# Patient Record
Sex: Female | Born: 1955 | Race: White | Hispanic: No | Marital: Single | Smoking: Former smoker
Health system: Southern US, Community
[De-identification: ages and names within clinical notes are randomized; demographics above are authoritative.]

## PROBLEM LIST (undated history)

## (undated) DIAGNOSIS — F17200 Nicotine dependence, unspecified, uncomplicated: Secondary | ICD-10-CM

## (undated) DIAGNOSIS — L719 Rosacea, unspecified: Secondary | ICD-10-CM

## (undated) DIAGNOSIS — R002 Palpitations: Secondary | ICD-10-CM

## (undated) DIAGNOSIS — F438 Other reactions to severe stress: Secondary | ICD-10-CM

## (undated) DIAGNOSIS — I701 Atherosclerosis of renal artery: Secondary | ICD-10-CM

## (undated) DIAGNOSIS — M810 Age-related osteoporosis without current pathological fracture: Secondary | ICD-10-CM

## (undated) DIAGNOSIS — I10 Essential (primary) hypertension: Secondary | ICD-10-CM

## (undated) DIAGNOSIS — N200 Calculus of kidney: Secondary | ICD-10-CM

## (undated) DIAGNOSIS — M79609 Pain in unspecified limb: Secondary | ICD-10-CM

## (undated) DIAGNOSIS — H409 Unspecified glaucoma: Secondary | ICD-10-CM

## (undated) HISTORY — DX: Atherosclerosis of renal artery: I70.1

## (undated) HISTORY — PX: TUBAL LIGATION: SHX77

## (undated) HISTORY — DX: Other reactions to severe stress: F43.8

## (undated) HISTORY — DX: Age-related osteoporosis without current pathological fracture: M81.0

## (undated) HISTORY — DX: Calculus of kidney: N20.0

## (undated) HISTORY — DX: Rosacea, unspecified: L71.9

## (undated) HISTORY — PX: OOPHORECTOMY: SHX86

## (undated) HISTORY — DX: Palpitations: R00.2

## (undated) HISTORY — DX: Essential (primary) hypertension: I10

## (undated) HISTORY — DX: Pain in unspecified limb: M79.609

## (undated) HISTORY — PX: DILATION AND CURETTAGE OF UTERUS: SHX78

## (undated) HISTORY — PX: URETERAL STENT PLACEMENT: SHX822

## (undated) HISTORY — DX: Unspecified glaucoma: H40.9

## (undated) HISTORY — DX: Nicotine dependence, unspecified, uncomplicated: F17.200

---

## 1998-01-28 ENCOUNTER — Other Ambulatory Visit: Admission: RE | Admit: 1998-01-28 | Discharge: 1998-01-28 | Payer: Self-pay | Admitting: *Deleted

## 1998-08-30 ENCOUNTER — Other Ambulatory Visit: Admission: RE | Admit: 1998-08-30 | Discharge: 1998-08-30 | Payer: Self-pay | Admitting: *Deleted

## 1999-09-05 ENCOUNTER — Other Ambulatory Visit: Admission: RE | Admit: 1999-09-05 | Discharge: 1999-09-05 | Payer: Self-pay | Admitting: *Deleted

## 1999-09-19 ENCOUNTER — Other Ambulatory Visit: Admission: RE | Admit: 1999-09-19 | Discharge: 1999-09-19 | Payer: Self-pay | Admitting: *Deleted

## 1999-09-19 ENCOUNTER — Encounter (INDEPENDENT_AMBULATORY_CARE_PROVIDER_SITE_OTHER): Payer: Self-pay | Admitting: Specialist

## 2000-10-09 ENCOUNTER — Other Ambulatory Visit: Admission: RE | Admit: 2000-10-09 | Discharge: 2000-10-09 | Payer: Self-pay | Admitting: *Deleted

## 2001-10-28 ENCOUNTER — Other Ambulatory Visit: Admission: RE | Admit: 2001-10-28 | Discharge: 2001-10-28 | Payer: Self-pay | Admitting: *Deleted

## 2002-11-25 ENCOUNTER — Other Ambulatory Visit: Admission: RE | Admit: 2002-11-25 | Discharge: 2002-11-25 | Payer: Self-pay | Admitting: *Deleted

## 2003-12-08 ENCOUNTER — Other Ambulatory Visit: Admission: RE | Admit: 2003-12-08 | Discharge: 2003-12-08 | Payer: Self-pay | Admitting: *Deleted

## 2004-07-18 ENCOUNTER — Ambulatory Visit: Payer: Self-pay | Admitting: Internal Medicine

## 2004-07-25 ENCOUNTER — Ambulatory Visit: Payer: Self-pay | Admitting: Internal Medicine

## 2004-12-12 ENCOUNTER — Other Ambulatory Visit: Admission: RE | Admit: 2004-12-12 | Discharge: 2004-12-12 | Payer: Self-pay | Admitting: *Deleted

## 2005-08-07 ENCOUNTER — Ambulatory Visit: Payer: Self-pay | Admitting: Internal Medicine

## 2005-08-15 ENCOUNTER — Ambulatory Visit: Payer: Self-pay | Admitting: Internal Medicine

## 2005-12-27 ENCOUNTER — Other Ambulatory Visit: Admission: RE | Admit: 2005-12-27 | Discharge: 2005-12-27 | Payer: Self-pay | Admitting: *Deleted

## 2006-05-17 ENCOUNTER — Ambulatory Visit: Payer: Self-pay | Admitting: Internal Medicine

## 2006-06-08 ENCOUNTER — Ambulatory Visit: Payer: Self-pay

## 2006-09-06 ENCOUNTER — Ambulatory Visit: Payer: Self-pay | Admitting: Internal Medicine

## 2006-09-06 LAB — CONVERTED CEMR LAB
ALT: 14 units/L (ref 0–40)
AST: 16 units/L (ref 0–37)
Albumin: 4 g/dL (ref 3.5–5.2)
Alkaline Phosphatase: 33 units/L — ABNORMAL LOW (ref 39–117)
BUN: 6 mg/dL (ref 6–23)
Basophils Relative: 1 % (ref 0.0–1.0)
Bilirubin Urine: NEGATIVE
Calcium: 9 mg/dL (ref 8.4–10.5)
Chloride: 105 meq/L (ref 96–112)
Creatinine, Ser: 0.7 mg/dL (ref 0.4–1.2)
Crystals: NEGATIVE
GFR calc non Af Amer: 95 mL/min
HCT: 38.1 % (ref 36.0–46.0)
HDL: 53 mg/dL (ref >39.0)
Hemoglobin: 12.9 g/dL (ref 12.0–15.0)
LDL Cholesterol: 77 mg/dL (ref 0–99)
Monocytes Absolute: 0.5 10*3/uL (ref 0.2–0.7)
Monocytes Relative: 7.8 % (ref 3.0–11.0)
Nitrite: NEGATIVE
RDW: 12.5 % (ref 11.5–14.6)
Specific Gravity, Urine: 1.02 (ref 1.000–1.03)
Total Bilirubin: 0.9 mg/dL (ref 0.3–1.2)
VLDL: 14 mg/dL (ref 0–40)

## 2006-09-13 ENCOUNTER — Ambulatory Visit: Payer: Self-pay | Admitting: Internal Medicine

## 2007-01-16 ENCOUNTER — Other Ambulatory Visit: Admission: RE | Admit: 2007-01-16 | Discharge: 2007-01-16 | Payer: Self-pay | Admitting: *Deleted

## 2007-06-05 ENCOUNTER — Encounter: Payer: Self-pay | Admitting: Internal Medicine

## 2007-12-04 ENCOUNTER — Encounter: Payer: Self-pay | Admitting: Internal Medicine

## 2008-03-10 ENCOUNTER — Ambulatory Visit: Payer: Self-pay | Admitting: Internal Medicine

## 2008-03-10 LAB — CONVERTED CEMR LAB
AST: 16 units/L (ref 0–37)
Albumin: 3.9 g/dL (ref 3.5–5.2)
Alkaline Phosphatase: 34 units/L — ABNORMAL LOW (ref 39–117)
BUN: 11 mg/dL (ref 6–23)
Bilirubin Urine: NEGATIVE
Bilirubin, Direct: 0.2 mg/dL (ref 0.0–0.3)
Chloride: 108 meq/L (ref 96–112)
Crystals: NEGATIVE
Eosinophils Absolute: 0.1 10*3/uL (ref 0.0–0.7)
Eosinophils Relative: 1.7 % (ref 0.0–5.0)
GFR calc Af Amer: 114 mL/min
GFR calc non Af Amer: 94 mL/min
HCT: 37.6 % (ref 36.0–46.0)
HDL: 52.3 mg/dL (ref >39.0)
LDL Cholesterol: 59 mg/dL (ref 0–99)
MCV: 90.4 fL (ref 78.0–100.0)
Monocytes Absolute: 0.5 10*3/uL (ref 0.1–1.0)
Mucus, UA: NEGATIVE
Neutrophils Relative %: 67.6 % (ref 43.0–77.0)
Nitrite: NEGATIVE
Platelets: 217 10*3/uL (ref 150–400)
Potassium: 4.4 meq/L (ref 3.5–5.1)
RDW: 12.3 % (ref 11.5–14.6)
Sodium: 141 meq/L (ref 135–145)
Total Bilirubin: 0.7 mg/dL (ref 0.3–1.2)
Urine Glucose: NEGATIVE mg/dL
Urobilinogen, UA: 0.2 (ref 0.0–1.0)
VLDL: 11 mg/dL (ref 0–40)
WBC, UA: NONE SEEN cells/hpf
WBC: 7.1 10*3/uL (ref 4.5–10.5)

## 2008-03-16 ENCOUNTER — Ambulatory Visit: Payer: Self-pay | Admitting: Internal Medicine

## 2008-03-16 LAB — HM COLONOSCOPY

## 2009-03-12 ENCOUNTER — Ambulatory Visit: Payer: Self-pay | Admitting: Internal Medicine

## 2009-03-12 LAB — CONVERTED CEMR LAB
AST: 16 units/L (ref 0–37)
Alkaline Phosphatase: 37 units/L — ABNORMAL LOW (ref 39–117)
BUN: 7 mg/dL (ref 6–23)
Bilirubin, Direct: 0.1 mg/dL (ref 0.0–0.3)
CO2: 30 meq/L (ref 19–32)
Chloride: 106 meq/L (ref 96–112)
Cholesterol: 136 mg/dL (ref 0–200)
Creatinine, Ser: 0.6 mg/dL (ref 0.4–1.2)
Eosinophils Absolute: 0.1 10*3/uL (ref 0.0–0.7)
Eosinophils Relative: 2 % (ref 0.0–5.0)
Glucose, Bld: 86 mg/dL (ref 70–99)
HCT: 36.1 % (ref 36.0–46.0)
Ketones, ur: NEGATIVE mg/dL
LDL Cholesterol: 65 mg/dL (ref 0–99)
Lymphs Abs: 1.6 10*3/uL (ref 0.7–4.0)
MCHC: 35.2 g/dL (ref 30.0–36.0)
MCV: 93 fL (ref 78.0–100.0)
Monocytes Absolute: 0.5 10*3/uL (ref 0.1–1.0)
Neutrophils Relative %: 64.7 % (ref 43.0–77.0)
Platelets: 210 10*3/uL (ref 150.0–400.0)
Potassium: 4.6 meq/L (ref 3.5–5.1)
Specific Gravity, Urine: 1.01 (ref 1.000–1.030)
Total CHOL/HDL Ratio: 2
Total Protein, Urine: NEGATIVE mg/dL
Urine Glucose: NEGATIVE mg/dL
WBC: 6.4 10*3/uL (ref 4.5–10.5)

## 2009-03-19 ENCOUNTER — Ambulatory Visit: Payer: Self-pay | Admitting: Internal Medicine

## 2010-03-23 ENCOUNTER — Ambulatory Visit: Payer: Self-pay | Admitting: Internal Medicine

## 2010-03-24 LAB — CONVERTED CEMR LAB
ALT: 12 units/L (ref 0–35)
BUN: 11 mg/dL (ref 6–23)
Bilirubin Urine: NEGATIVE
CO2: 28 meq/L (ref 19–32)
Chloride: 106 meq/L (ref 96–112)
Cholesterol: 134 mg/dL (ref 0–200)
Eosinophils Relative: 1.3 % (ref 0.0–5.0)
Glucose, Bld: 93 mg/dL (ref 70–99)
HCT: 36.6 % (ref 36.0–46.0)
Leukocytes, UA: NEGATIVE
Lymphs Abs: 1.5 10*3/uL (ref 0.7–4.0)
MCHC: 34.3 g/dL (ref 30.0–36.0)
MCV: 92.1 fL (ref 78.0–100.0)
Monocytes Absolute: 0.5 10*3/uL (ref 0.1–1.0)
Nitrite: NEGATIVE
Platelets: 213 10*3/uL (ref 150.0–400.0)
Potassium: 5.2 meq/L — ABNORMAL HIGH (ref 3.5–5.1)
RDW: 13 % (ref 11.5–14.6)
TSH: 1.48 microintl units/mL (ref 0.35–5.50)
Total Bilirubin: 0.6 mg/dL (ref 0.3–1.2)
WBC: 7.5 10*3/uL (ref 4.5–10.5)
pH: 7.5 (ref 5.0–8.0)

## 2010-03-30 ENCOUNTER — Encounter (INDEPENDENT_AMBULATORY_CARE_PROVIDER_SITE_OTHER): Payer: Self-pay | Admitting: *Deleted

## 2010-03-30 ENCOUNTER — Ambulatory Visit: Payer: Self-pay | Admitting: Internal Medicine

## 2010-03-30 ENCOUNTER — Encounter: Payer: Self-pay | Admitting: Internal Medicine

## 2010-03-30 DIAGNOSIS — M79609 Pain in unspecified limb: Secondary | ICD-10-CM

## 2010-03-30 HISTORY — DX: Pain in unspecified limb: M79.609

## 2010-04-13 ENCOUNTER — Encounter: Payer: Self-pay | Admitting: Sports Medicine

## 2010-05-19 ENCOUNTER — Ambulatory Visit
Admission: RE | Admit: 2010-05-19 | Discharge: 2010-05-19 | Payer: Self-pay | Source: Home / Self Care | Attending: Sports Medicine | Admitting: Sports Medicine

## 2010-05-22 ENCOUNTER — Encounter: Payer: Self-pay | Admitting: Family Medicine

## 2010-06-09 NOTE — Consult Note (Signed)
Summary: MURPHY & Delbert Harness & Thurston Hole   Imported By: Abundio Miu 04/14/2010 11:24:42  _____________________________________________________________________  External Attachment:    Type:   Image     Comment:   External Document

## 2010-06-09 NOTE — Assessment & Plan Note (Signed)
Summary: NP,ORTHOTICS PER JACOBS,MC   Vital Signs:  Patient profile:   55 year old female Height:      66 inches Weight:      136 pounds BMI:     22.03 Pulse rate:   76 / minute BP sitting:   142 / 80  (left arm)  Vitals Entered By: Rochele Pages RN (May 19, 2010 11:20 AM) CC: NP- orthotics   CC:  NP- orthotics.  History of Present Illness: 55yo female referred to office by Dr. Margaretha Sheffield for custom orthotics.  She has long-standing hx of PF & foot pain.  Has used custom orthotics in the past, has gotten these from [facility] previously.  Currently has MT pad on R orthotic which is comfortable.  She is on her feet for prolonged periods of time & standing on concrete for her job.  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  Pain mainly along her MT heads at this time.  Denies any swelling.  Denies any numbness or tingling.  Preventive Screening-Counseling & Management  Alcohol-Tobacco     Smoking Status: never  Allergies: No Known Drug Allergies  Past History:  Past Medical History: Last updated: 03/19/2009 glaucoma - sees optho/dr groat q 6 mo rosacea palpitations  Past Surgical History: Last updated: 03/16/2008 s/p D&C Tubal ligation  Family History: Last updated: 03/16/2008 heart disease - father HTN - mother kidney stones- father uncle with ENT cancer  Social History: Last updated: 03/30/2010 Alcohol use-yes work - [place] [employer] Current Smoker Single no children Drug use-no  Social History: Smoking Status:  never  Review of Systems       Per HPI, otherwise 12-point ROS negative  Physical Exam  General:  Well-developed,well-nourished,in no acute distress; alert,appropriate and cooperative throughout examination Head:  [place]/AT Eyes:  vision grossly intact.   Mouth:  MM moist & pink Neck:  supple.   Lungs:  normal respiratory effort.   Msk:  ANKLES: FROM without pain, swelling, weakness,  or instability.  FEET: bilateral collapse of transverse arches with splaying between toes - more prominent on R compared to left.  Longitudinal arch preserved b/l.  Rt foot with prominent callous along 1st & 2nd MT heads, TTP along 2nd & 3rd MT heads, small bunion, no pain over PF today.  Lt foot with no significant callous formation, no significant tenderness over MT heads or PF.    GAIT: no leg length difference, walks without a limp, no overpronation or over-supination.   Pulses:  +2/4 DP & PT Neurologic:  sensation intact to light touch.     Impression & Recommendations:  Problem # 1:  FOOT PAIN, BILATERAL (ICD-729.5) - Foot pain secondary to metatarsalgia & with hx of plantar fasciitis - Fitted with custom orthotics in office today: Patient was fitted for a : standard, cushioned, semi-rigid orthotic. The orthotic was heated and afterward the patient stood on the orthotic blank positioned on the orthotic stand. The patient was positioned in subtalar neutral position and 10 degrees of ankle dorsiflexion in a weight bearing stance. After completion of molding, a stable base was applied to the orthotic blank. The blank was ground to a stable position for weight bearing. Size: 8 Base: Blue swirl Posting: Blue EVA Additional orthotic padding: metatarsal cookie on rt  Gait neutral with orthotics - f/u as needed, encouraged to call with any questions or concerns.  Orders: Orthotic Materials, each unit [id number])  Complete Medication List: 1)  Xalatan 0.005 % Soln (Latanoprost) .... Use asd   Orders Added: 1)  Consultation Level III [[address]] 2)  Orthotic Materials, each unit [L3002]

## 2010-06-09 NOTE — Letter (Signed)
Summary: *Consult Note  [facility]  [address]   [place], [place] [address]   Phone: [phone]  Fax: [phone]    Re:    [name] DOB:    10-Apr-1956 05/19/10   Dear Dr. Margaretha Sheffield:    Thank you for requesting that we see the above patient for consultation.  A copy of the detailed office note will be sent under separate cover, for your review.  Evaluation today is consistent with:  1) FOOT PAIN (729.5)   Our recommendation is for: Jane Mcdonald was fitted with custom orthotics in office today with metarsal cookie on right orthotic.  Should follow-up with you as scheduled, may follow-up with [place] as needed.   New Orders include:  1)  Consultation Level III [13086] 2)  Orthotic Materials, each unit [L3002]   New Medications started today include: None   After today's visit, the patients current medications include: 1)  XALATAN 0.005 % SOLN (LATANOPROST) use asd   Thank you for this consultation.  If you have any further questions regarding the care of this patient, please do not hesitate to contact me @ [phone].  Thank you for this opportunity to look after your patient.  Sincerely,   Darene Lamer, DO ([facility]) Roanna Epley, MD

## 2010-06-09 NOTE — Assessment & Plan Note (Signed)
Summary: cpx/bcbs/#/cd   Vital Signs:  Patient profile:   55 year old female Height:      66 inches Weight:      136.25 pounds BMI:     22.07 O2 Sat:      98 % on Room air Temp:     98.5 degrees F oral Pulse rate:   78 / minute BP sitting:   120 / 72  (left arm) Cuff size:   regular  Vitals Entered By: Zella Ball Ewing CMA Duncan Dull) (March 30, 2010 2:01 PM)  O2 Flow:  Room air  Preventive Care Screening  Mammogram:    Date:  12/06/2009    Results:  normal   Pap Smear:    Date:  09/06/2009    Results:  normal      declines colonscopy  CC: Adult Physical/RE   CC:  Adult Physical/RE.  History of Present Illness: here for wellness, no specific complaints, is considering seeing podiatry but not yet decided to do it for her chronic plantar pain;  Pt denies CP, worsening sob, doe, wheezing, orthopnea, pnd, worsening LE edema, palps, dizziness or syncope  Pt denies new neuro symptoms such as headache, facial or extremity weakness  Pt denies polydipsia, polyuria  Overall good compliance with meds, trying to follow low chol diet, wt stable, little excercise however .  Denies worsening depressive symptoms, suicidal ideation, or panic.   No fever, wt loss, night sweats, loss of appetite or other constitutional symptoms  Overall good compliance with meds, and good tolerability.  Pt states good ability with ADL's, low fall risk, home safety reviewed and adequate, no significant change in hearing or vision, trying to follow lower chol diet, and occasionally active only with regular excercise.   Preventive Screening-Counseling & Management      Drug Use:  no.    Problems Prior to Update: 1)  Foot Pain, Bilateral  (ICD-729.5) 2)  Preventive Health Care  (ICD-V70.0)  Medications Prior to Update: 1)  Xalatan 0.005 % Soln (Latanoprost) .... Use Asd  Current Medications (verified): 1)  Xalatan 0.005 % Soln (Latanoprost) .... Use Asd  Allergies (verified): No Known Drug Allergies  Past  History:  Past Medical History: Last updated: 03/19/2009 glaucoma - sees optho/dr groat q 6 mo rosacea palpitations  Past Surgical History: Last updated: 03/16/2008 s/p D&C Tubal ligation  Family History: Last updated: 03/16/2008 heart disease - father HTN - mother kidney stones- father uncle with ENT cancer  Social History: Last updated: 03/30/2010 Alcohol use-yes work - [place] [employer] Current Smoker Single no children Drug use-no  Risk Factors: Smoking Status: current (03/16/2008)  Social History: Reviewed history from 03/16/2008 and no changes required. Alcohol use-yes work - [place] [employer] Current Smoker Single no children Drug use-no Drug Use:  no  Review of Systems  The patient denies anorexia, fever, vision loss, decreased hearing, hoarseness, chest pain, syncope, dyspnea on exertion, peripheral edema, prolonged cough, headaches, hemoptysis, abdominal pain, melena, hematochezia, severe indigestion/heartburn, hematuria, muscle weakness, suspicious skin lesions, transient blindness, difficulty walking, depression, unusual weight change, abnormal bleeding, enlarged lymph nodes, and angioedema.         all otherwise negative per pt -    Physical Exam  General:  alert and well-developed.   Head:  normocephalic and atraumatic.   Eyes:  vision grossly intact, pupils equal, and pupils round.   Ears:  R ear normal and L ear normal.   Nose:  no external deformity and no nasal discharge.  Mouth:  no gingival abnormalities and pharynx pink and moist.   Neck:  supple and no masses.   Lungs:  normal respiratory effort and normal breath sounds.   Heart:  normal rate and regular rhythm.   Abdomen:  soft, non-tender, and normal bowel sounds.   Msk:  no joint tenderness and no joint swelling.   Extremities:  no edema, no erythema  Neurologic:  cranial nerves II-XII intact and strength normal in all extremities.   Skin:  color normal and no rashes.   Psych:  not  depressed appearing and slightly anxious.     Impression & Recommendations:  Problem # 1:  Preventive Health Care (ICD-V70.0) Overall doing well, age appropriate education and counseling updated, referral for preventive services and immunizations addressed, dietary counseling and smoking status adressed , most recent labs reviewed, ecg reviewed I have personally reviewed and have noted 1.The patient's medical and social history 2.Their use of alcohol, tobacco or illicit drugs 3.Their current medications and supplements 4. Functional ability including ADL's, fall risk, home safety risk, hearing & visual impairment  5.Diet and physical activities 6.Evidence for depression or mood disorders The patients weight, height, BMI  have been recorded in the chart I have made referrals, counseling and provided education to the patient based review of the above  Orders: EKG w/ Interpretation (93000)  Problem # 2:  FOOT PAIN, BILATERAL (ICD-729.5)  refer podiatry when pt wants  Complete Medication List: 1)  Xalatan 0.005 % Soln (Latanoprost) .... Use asd  Other Orders: Admin 1st Vaccine (09811) Flu Vaccine 66yrs + [phone])  Patient Instructions: 1)  you had the flu shot today 2)  You will be contacted about the referral(s) to: podiatry 3)  please stop smoking 4)  please call if you change your mind about having the colon test 5)  Continue all previous medications as before this visit  6)  Please schedule a follow-up appointment in 1 year, or sooner if needed   Orders Added: 1)  Admin 1st Vaccine [90471] 2)  Flu Vaccine 20yrs + [90658] 3)  EKG w/ Interpretation [93000] 4)  Est. Patient 40-64 years [99396]  Flu Vaccine Consent Questions     Do you have a history of severe allergic reactions to this vaccine? no    Any prior history of allergic reactions to egg and/or gelatin? no    Do you have a sensitivity to the preservative Thimersol? no    Do you have a past history of Guillan-Barre  Syndrome? no    Do you currently have an acute febrile illness? no    Have you ever had a severe reaction to latex? no    Vaccine information given and explained to patient? yes    Are you currently pregnant? no    Lot Number:[id number]   Exp Date:11/05/2010   Site Given  Left Deltoid IM       .[email or web address]

## 2010-06-09 NOTE — Letter (Signed)
Summary: [facility] Consult Scheduled Letter  [facility]  [address] [place], [place] [address]   Phone: [phone]  Fax: [phone]      03/30/2010 MRN: [id number]  Jane Mcdonald [address] [place], [place]  [address]    Dear Ms. Seidl,      We have scheduled an appointment for you. At the recommendation of Dr.John, we have scheduled you a consult with [facility] (Dr. Leeanne Deed) on November 30,2011 at10:00 am [facility] Arms 15 min early.Their phone number is [phone].If this appointment day and time is not convenient for you, please feel free to call the office of the doctor you are being referred to at the number listed above and reschedule the appointment.   [facility]  [address] [place],  [place], [place] [address] [phone]  Thank you,  Patient Care Coordinator [facility]

## 2010-07-14 ENCOUNTER — Encounter: Payer: Self-pay | Admitting: Internal Medicine

## 2010-07-14 ENCOUNTER — Ambulatory Visit (INDEPENDENT_AMBULATORY_CARE_PROVIDER_SITE_OTHER): Payer: BC Managed Care – PPO | Admitting: Internal Medicine

## 2010-07-14 DIAGNOSIS — F438 Other reactions to severe stress: Secondary | ICD-10-CM | POA: Insufficient documentation

## 2010-07-14 DIAGNOSIS — F17200 Nicotine dependence, unspecified, uncomplicated: Secondary | ICD-10-CM

## 2010-07-14 DIAGNOSIS — F4389 Other reactions to severe stress: Secondary | ICD-10-CM

## 2010-07-14 DIAGNOSIS — I10 Essential (primary) hypertension: Secondary | ICD-10-CM | POA: Insufficient documentation

## 2010-07-14 DIAGNOSIS — F419 Anxiety disorder, unspecified: Secondary | ICD-10-CM | POA: Insufficient documentation

## 2010-07-14 HISTORY — DX: Other reactions to severe stress: F43.8

## 2010-07-14 HISTORY — DX: Nicotine dependence, unspecified, uncomplicated: F17.200

## 2010-07-14 HISTORY — DX: Other reactions to severe stress: F43.89

## 2010-07-14 HISTORY — DX: Essential (primary) hypertension: I10

## 2010-07-19 NOTE — Assessment & Plan Note (Signed)
Summary: BP IS ELEV /NWS  #   Vital Signs:  Patient profile:   55 year old female Height:      66 inches (167.64 cm) Weight:      133 pounds (60.45 kg) BMI:     21.54 O2 Sat:      98 % on Room air Temp:     98.5 degrees F (36.94 degrees C) oral Pulse rate:   82 / minute Resp:     16 per minute BP sitting:   158 / 92  (left arm) Cuff size:   regular  Vitals Entered By: Burnard Leigh CMA(AAMA) (July 14, 2010 4:45 PM)  O2 Flow:  Room air CC: Elevated BP x2 mths.Pt c/o of feeling more fatigued/sls,cma Is Patient Diabetic? No Comments Pt's BP machine reading: 173/93   CC:  Elevated BP x2 mths.Pt c/o of feeling more fatigued/sls and cma.  History of Present Illness: here to f/u;  has been checking BP 's at home quite frequently in the past 2 months and has been elevated, averaging in the 150's, occasionalyl higher.  Pt denies CP, worsening sob, doe, wheezing, orthopnea, pnd, worsening LE edema, palps, dizziness or syncope  Pt denies new neuro symptoms such as headache, facial or extremity weakness  Pt denies polydipsia, polyuria.  Still smoking but trying to quit.  c/o fatigue in general but no OSA symptoms and Denies worsening depressive symptoms, suicidal ideation, or panic.    Problems Prior to Update: 1)  Hypertension  (ICD-401.9) 2)  Foot Pain, Bilateral  (ICD-729.5) 3)  Preventive Health Care  (ICD-V70.0)  Medications Prior to Update: 1)  Xalatan 0.005 % Soln (Latanoprost) .... Use Asd  Current Medications (verified): 1)  Xalatan 0.005 % Soln (Latanoprost) .... Use Asd 2)  Valtrex 500 Mg Tabs (Valacyclovir Hcl) .... (1) Once Daily 3)  Vitamin D3 Tabs .... (1) Once Daily 4)  Benicar 20 Mg Tabs (Olmesartan Medoxomil) .Marland Kitchen.. 1po Once Daily  Allergies (verified): No Known Drug Allergies  Past History:  Past Surgical History: Last updated: 03/16/2008 s/p D&C Tubal ligation  Social History: Last updated: 03/30/2010 Alcohol use-yes work - [place] [employer] Current  Smoker Single no children Drug use-no  Risk Factors: Smoking Status: never (05/19/2010)  Past Medical History: glaucoma - sees optho/dr groat q 6 mo rosacea palpitations Hypertension  Review of Systems       all otherwise negative per pt -    Physical Exam  General:  alert and well-developed.   Head:  normocephalic and atraumatic.   Eyes:  vision grossly intact, pupils equal, and pupils round.   Ears:  R ear normal and L ear normal.   Nose:  no external deformity and no nasal discharge.   Mouth:  no gingival abnormalities and pharynx pink and moist.   Neck:  supple and no masses.   Lungs:  normal respiratory effort and normal breath sounds.   Heart:  normal rate and regular rhythm.   Msk:  no flank bruit Extremities:  no edema, no erythema  Psych:  not depressed appearing and slightly anxious.     Impression & Recommendations:  Problem # 1:  HYPERTENSION (ICD-401.9) Assessment New  new onset, persistent mild to mod;  in smoker ;  for benicar 20 mg (with f/u renal fxn 2 wks) -gave samples to start HALF for 5 days, with BP f/u at home - for 20 mg if still > 140/90  Her updated medication list for this problem includes:  Benicar 20 Mg Tabs (Olmesartan medoxomil) .Marland Kitchen... 1po once daily  Orders: Radiology Referral (Radiology)  Problem # 2:  SMOKER (ICD-305.1) Assessment: Unchanged d/w pt   - urged to quit, declines chantix at this time;  d/w pt the increased CV risk or HTN and smoking  Problem # 3:  ANXIETY, SITUATIONAL (ICD-308.3) d/w pt   - not likely to be signficant factor in persistent elev BP, declines tx such as SSRI trial  Complete Medication List: 1)  Xalatan 0.005 % Soln (Latanoprost) .... Use asd 2)  Valtrex 500 Mg Tabs (Valacyclovir hcl) .... (1) once daily 3)  Vitamin D3 Tabs  .... (1) once daily 4)  Benicar 20 Mg Tabs (Olmesartan medoxomil) .Marland Kitchen.. 1po once daily  Patient Instructions: 1)  please start the samples of Benicar 20 mg at HALF per day for  5 days,  then if BP is still > 140/90 you should increase the medication by taking the whole 20 mg pill 2)  You will be contacted about the referral(s) to: renal artery ultrasound 3)  please stop smoking 4)  Please schedule a follow-up appointment in 2 weeks with prior: 5)  BMP prior to visit, ICD-9: 401.1 Prescriptions: BENICAR 20 MG TABS (OLMESARTAN MEDOXOMIL) 1po once daily  #30 x 11   Entered and Authorized by:   Corwin Levins MD   Signed by:   Corwin Levins MD on 07/14/2010   Method used:   Print then Give to Patient   RxID:   [id number]    Orders Added: 1)  Radiology Referral [Radiology] 2)  Est. Patient Level IV [[address]]

## 2010-07-22 ENCOUNTER — Encounter: Payer: Self-pay | Admitting: Internal Medicine

## 2010-07-25 ENCOUNTER — Encounter (INDEPENDENT_AMBULATORY_CARE_PROVIDER_SITE_OTHER): Payer: Federal, State, Local not specified - PPO

## 2010-07-25 DIAGNOSIS — I10 Essential (primary) hypertension: Secondary | ICD-10-CM

## 2010-07-25 DIAGNOSIS — I701 Atherosclerosis of renal artery: Secondary | ICD-10-CM

## 2010-07-26 NOTE — Miscellaneous (Signed)
Summary: Orders Update  Clinical Lists Changes  Orders: Added new Test order of Renal Artery Duplex (Renal Artery Duplex) - Signed 

## 2010-07-28 ENCOUNTER — Telehealth: Payer: Self-pay | Admitting: Internal Medicine

## 2010-07-28 DIAGNOSIS — I701 Atherosclerosis of renal artery: Secondary | ICD-10-CM | POA: Insufficient documentation

## 2010-07-28 HISTORY — DX: Atherosclerosis of renal artery: I70.1

## 2010-07-28 NOTE — Telephone Encounter (Signed)
Received lab result c/w prob bilat RAS possibly due to fibromuscular dysplasia  Will refer to Dr Cooper/vascular/ cardiology   Robin to inform pt

## 2010-07-28 NOTE — Telephone Encounter (Signed)
Called patient informed of lab results and referral. Also, patient was seen on July 14, 2010 and did not schedule 2 week follow up and prior lab as requested by MD. I did schedule patient for the lab on 07/29/10 and 2 week follow up on 07/06/10 at 3:30 with Dr. Jonny Ruiz.

## 2010-07-28 NOTE — Assessment & Plan Note (Signed)
Received lab result c/w prob bilat RAS possibly due to fibromuscular dysplasia  Will refer to Dr Cooper/vascular/ cardiology

## 2010-07-29 ENCOUNTER — Other Ambulatory Visit (INDEPENDENT_AMBULATORY_CARE_PROVIDER_SITE_OTHER): Payer: Federal, State, Local not specified - PPO

## 2010-07-29 DIAGNOSIS — I10 Essential (primary) hypertension: Secondary | ICD-10-CM

## 2010-07-29 LAB — BASIC METABOLIC PANEL
CO2: 28 mEq/L (ref 19–32)
Chloride: 105 mEq/L (ref 96–112)
Creatinine, Ser: 0.7 mg/dL (ref 0.4–1.2)
Potassium: 4.4 mEq/L (ref 3.5–5.1)
Sodium: 139 mEq/L (ref 135–145)

## 2010-08-04 ENCOUNTER — Encounter: Payer: Self-pay | Admitting: Internal Medicine

## 2010-08-04 ENCOUNTER — Ambulatory Visit (INDEPENDENT_AMBULATORY_CARE_PROVIDER_SITE_OTHER): Payer: Federal, State, Local not specified - PPO | Admitting: Internal Medicine

## 2010-08-04 VITALS — BP 130/70 | HR 95 | Temp 98.5°F | Ht 66.0 in | Wt 135.4 lb

## 2010-08-04 DIAGNOSIS — I701 Atherosclerosis of renal artery: Secondary | ICD-10-CM

## 2010-08-04 DIAGNOSIS — R002 Palpitations: Secondary | ICD-10-CM | POA: Insufficient documentation

## 2010-08-04 DIAGNOSIS — I10 Essential (primary) hypertension: Secondary | ICD-10-CM

## 2010-08-04 DIAGNOSIS — F438 Other reactions to severe stress: Secondary | ICD-10-CM

## 2010-08-04 DIAGNOSIS — Z0001 Encounter for general adult medical examination with abnormal findings: Secondary | ICD-10-CM | POA: Insufficient documentation

## 2010-08-04 DIAGNOSIS — Z0000 Encounter for general adult medical examination without abnormal findings: Secondary | ICD-10-CM | POA: Insufficient documentation

## 2010-08-04 MED ORDER — METOPROLOL TARTRATE 25 MG PO TABS: ORAL_TABLET | ORAL | Status: DC

## 2010-08-04 NOTE — Assessment & Plan Note (Signed)
Much improved and now controlled with SBP < 140, tolerating med well, and recent f/u BPM mar 23 normal on current med. The current medical regimen is effective;  continue present plan and medications.

## 2010-08-04 NOTE — Assessment & Plan Note (Signed)
Recurrent for several yrs, more in the past 3 mo but not assoc with other symptoms;  Will add back the metoprolol 25 mg 1/2 bid prn she did well with in the past

## 2010-08-04 NOTE — Assessment & Plan Note (Signed)
Recent renal artery u/s with right RAS approx 60% , ? Fibromuscular dysplasia related;  For f/u with Dr Bartholomew Boards

## 2010-08-04 NOTE — Assessment & Plan Note (Signed)
stable overall by hx and exam, most recent lab reviewed with pt, and pt to continue medical treatment as before 

## 2010-08-04 NOTE — Progress Notes (Signed)
Subjective:    Patient ID: Jane Mcdonald, female    DOB: 1955-07-03, 55 y.o.   MRN: [id number]  HPI  Here to f/u; overall doing better in that she has been able to quit smoking for 3 wks; still taking half of the initial benicar 20 mg without apparent symptoms or side effects ;  Did check BP at home once and recent GYN with SBP approx 120;  Unfortunately was informed she has large left ovarian cyst , with mild discomfort but overall size larger than the uterus and has a "wall" to it - so after d/w pt the Ca-125 was drawn and very likely for surgury relatively soon.  Pt denies chest pain, increased sob or doe, wheezing, orthopnea, PND, increased LE swelling,  dizziness or syncope. Pt denies new neurological symptoms such as new headache, or facial or extremity weakness or numbness.  Pt denies polydipsia, polyuria. Has had occasional palpitations with recent stress, similar to that a few yrs ago where metoprolol was effective in controlling.   Pt denies fever, wt loss, night sweats, loss of appetite, or other constitutional symptoms.  Overall good compliance with treatment, and good  medicine tolerability. Denies worsening depressive symptoms, suicidal ideation, or panic, though has ongoing anxiety, not increased recently.    Past Medical History  Diagnosis Date  . HYPERTENSION 07/14/2010  . Renal artery stenosis, native, bilateral 07/28/2010  . FOOT PAIN, BILATERAL 03/30/2010  . SMOKER 07/14/2010  . ANXIETY, SITUATIONAL 07/14/2010  . Glaucoma     sees optho/Dr. Dione Booze every 6 months  . Rosacea   . Palpitations    Past Surgical History  Procedure Date  . Dilation and curettage of uterus     s/p  . Tubal ligation     reports that she has never smoked. She does not have any smokeless tobacco history on file. Her alcohol and drug histories not on file. family history includes Cancer in her other; Heart disease in her father; Hypertension in her mother; and Nephrolithiasis in her father. Not on  File  Current Outpatient Prescriptions on File Prior to Visit  Medication Sig Dispense Refill  . Cholecalciferol (VITAMIN D-3 PO) Take by mouth daily.        Marland Kitchen latanoprost (XALATAN) 0.005 % ophthalmic solution 1 drop. Use as directed       . olmesartan (BENICAR) 20 MG tablet Take 20 mg by mouth daily.        . valACYclovir (VALTREX) 500 MG tablet Take 500 mg by mouth daily.         Review of Systems Review of Systems  Constitutional: Negative for diaphoresis and unexpected weight change.  HENT: Negative for drooling and tinnitus.   Eyes: Negative for photophobia and visual disturbance.  Respiratory: Negative for choking and stridor.   Gastrointestinal: Negative for vomiting and blood in stool.  Genitourinary: Negative for hematuria and decreased urine volume.  Musculoskeletal: Negative for gait problem.  Skin: Negative for color change and wound.  Neurological: Negative for tremors and numbness.  Psychiatric/Behavioral: Negative for decreased concentration. The patient is not hyperactive.      Objective:   Physical Exam BP 130/70  Pulse 95  Temp(Src) 98.5 F (36.9 C) (Oral)  Ht 5\' 6"  (1.676 m)  Wt 135 lb 6 oz (61.406 kg)  BMI 21.85 kg/m2  SpO2 97%  LMP 07/22/2010  Physical Exam  VS noted Constitutional: Pt appears well-developed and well-nourished.  HENT: Head: Normocephalic.  Right Ear: External ear normal.  Left Ear: External ear  normal.  Eyes: Conjunctivae and EOM are normal. Pupils are equal, round, and reactive to light.  Neck: Normal range of motion. Neck supple.  Cardiovascular: Normal rate and regular rhythm.   Pulmonary/Chest: Effort normal and breath sounds normal.  Abd:  Soft, NT, non-distended, + BS Neurological: Pt is alert. No cranial nerve deficit.  Skin: Skin is warm. No erythema.  Psychiatric: Pt behavior is normal. Thought content normal.   Assessment & Plan:

## 2010-08-04 NOTE — Patient Instructions (Addendum)
Take all new medications as prescribed Continue all other medications as before Please keep your appointments with your specialists as you have planned Please return in November 2012 with Lab testing done 3-5 days before

## 2010-08-15 ENCOUNTER — Telehealth: Payer: Self-pay

## 2010-08-15 MED ORDER — LISINOPRIL 20 MG PO TABS: 20.0000 mg | ORAL_TABLET | Freq: Every day | ORAL | Status: DC

## 2010-08-15 NOTE — Telephone Encounter (Signed)
Called patient left message to inform of BP medication change

## 2010-08-15 NOTE — Telephone Encounter (Signed)
benicar changed to lisinopril 20 mg; please notify pt

## 2010-08-15 NOTE — Telephone Encounter (Signed)
Called patient; left message to call back.

## 2010-08-24 ENCOUNTER — Ambulatory Visit (INDEPENDENT_AMBULATORY_CARE_PROVIDER_SITE_OTHER): Payer: Federal, State, Local not specified - PPO | Admitting: Cardiovascular Disease

## 2010-08-24 ENCOUNTER — Encounter: Payer: Self-pay | Admitting: Cardiovascular Disease

## 2010-08-24 VITALS — BP 134/78 | HR 76 | Resp 18 | Ht 65.0 in | Wt 130.8 lb

## 2010-08-24 DIAGNOSIS — R0989 Other specified symptoms and signs involving the circulatory and respiratory systems: Secondary | ICD-10-CM

## 2010-08-24 DIAGNOSIS — I701 Atherosclerosis of renal artery: Secondary | ICD-10-CM

## 2010-08-24 NOTE — Patient Instructions (Signed)
Your physician has requested that you have a carotid duplex. This test is an ultrasound of the carotid arteries in your neck. It looks at blood flow through these arteries that supply the brain with blood. Allow one hour for this exam. There are no restrictions or special instructions.  Your physician wants you to follow-up in: 1 YEAR.  You will receive a reminder letter in the mail two months in advance. If you don't receive a letter, please call our office to schedule the follow-up appointment.  Your physician has requested that you have a renal artery duplex in 1 YEAR. During this test, an ultrasound is used to evaluate blood flow to the kidneys. Allow one hour for this exam. Do not eat after midnight the day before and avoid carbonated beverages. Take your medications as you usually do.

## 2010-08-24 NOTE — Progress Notes (Signed)
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 The patient has been diagnosed with hypertension in the past 12 months. She has been regularly followed and was not noted to have elevated blood pressures prior to this. She does have a family history of hypertension as her mother was diagnosed at approximately age 36. She has been treated with Benicar and has had reasonably good control of her blood pressure. The expense of this medication has become too much and she is going to switch to lisinopril soon. The patient was also started recently on metoprolol to be used as needed for palpitations. She has not taken this medication yet.  As part of her evaluation for hypertension she underwent a renal artery duplex scan. This demonstrated a peak systolic velocity of 345 cm/s in the right mid renal artery and a peak velocity of 280 cm/s in the distal left renal artery. There was beading of the renal arteries noted and this was suggestive of fibromuscular dysplasia. There was no significant ostial disease seen.  The patient has no history of TIA or stroke. She has no history of cardiac problems. There is no family history of aneurysm or dissection. She denies chest pain, dyspnea, or edema. She has been diagnosed with a large ovarian cyst and is scheduled for upcoming surgery.  Outpatient Encounter Prescriptions as of 08/24/2010  Medication Sig Dispense Refill  . Cholecalciferol (VITAMIN D-3 PO) Take by mouth daily.        Marland Kitchen latanoprost (XALATAN) 0.005 % ophthalmic solution 1 drop. Use as directed       . olmesartan (BENICAR) 20 MG tablet Take by mouth daily. Take 1/2 tablet daily       . valACYclovir (VALTREX) 500 MG tablet Take 500 mg by mouth daily.        Marland Kitchen lisinopril (PRINIVIL,ZESTRIL) 20 MG tablet Take 1 tablet (20 mg total) by mouth daily.  30 tablet  11  . metoprolol tartrate (LOPRESSOR) 25 MG tablet 1/2 by mouth twice per day as needed for  palpitations  30 tablet  11    Review of patient's allergies indicates no known allergies.  Past Medical History  Diagnosis Date  . HYPERTENSION 07/14/2010  . Renal artery stenosis, native, bilateral 07/28/2010  . FOOT PAIN, BILATERAL 03/30/2010  . SMOKER 07/14/2010  . ANXIETY, SITUATIONAL 07/14/2010  . Glaucoma     sees optho/Dr. Dione Booze every 6 months  . Rosacea   . Palpitations     Past Surgical History  Procedure Date  . Dilation and curettage of uterus     s/p  . Tubal ligation     History   Social History  . Marital Status: Single    Spouse Name: N/A    Number of Children: N/A  . Years of Education: N/A   Occupational History  . Not on file.   Social History Main Topics  . Smoking status: Never Smoker   . Smokeless tobacco: Not on file  . Alcohol Use: Not on file  . Drug Use: Not on file  . Sexually Active: Not on file   Other Topics Concern  . Not on file   Social History Narrative  . No narrative on file    Family History  Problem Relation Age of Onset  . Hypertension Mother   . Heart disease Father   . Nephrolithiasis Father   . Cancer Other     ENT Cancer    ROS: General: no fevers/chills/night  sweats Eyes: no blurry vision, diplopia, or amaurosis ENT: no sore throat or hearing loss Resp: no cough, wheezing, or hemoptysis CV: no edema or palpitations GI: no abdominal pain, nausea, vomiting, diarrhea, or constipation GU: no dysuria, frequency, or hematuria Skin: no rash Neuro: no headache, numbness, tingling, or weakness of extremities Musculoskeletal: no joint pain or swelling Heme: no bleeding, DVT, or easy bruising Endo: no polydipsia or polyuria  BP 134/78  Pulse 76  Resp 18  Ht 5\' 5"  (1.651 m)  Wt 130 lb 12.8 oz (59.33 kg)  BMI 21.77 kg/m2  LMP 07/22/2010  PHYSICAL EXAM: Pt is alert and oriented, WD, WN, in no distress. HEENT: normal Neck: JVP normal. Carotid upstrokes normal with a right carotid bruit. No thyromegaly. Lungs:  equal expansion, clear bilaterally CV: Apex is discrete and nondisplaced, RRR without murmur or gallop Abd: soft, NT, +BS, no bruit, no hepatosplenomegaly Back: no CVA tenderness Ext: no C/C/E        Femoral pulses 2+= without bruits        DP/PT pulses intact and = Skin: warm and dry without rash Neuro: CNII-XII intact             Strength intact = bilaterally  EKG: normal sinus rhythm 78 beats per minute, within normal limits.  ASSESSMENT AND PLAN:

## 2010-08-24 NOTE — Assessment & Plan Note (Signed)
The patient has typical ultrasound findings of fibromuscular dysplasia. The velocities are borderline for significant stenosis and I think this is most likely an incidental finding in a patient with essential hypertension. She has good blood pressure control and a single drug and preserve renal function and I would recommend continued medical treatment and observation at this point. She does have a right carotid bruit and there is a high likelihood of carotid fibromuscular dysplasia in patients who have renal FMD. Recommend a carotid duplex scan for further evaluation. This can be done electively after her surgery for the ovarian cyst. I would like to see the patient back in one year with a followup renal duplex scan at that time.

## 2010-08-25 ENCOUNTER — Other Ambulatory Visit (HOSPITAL_COMMUNITY): Payer: Self-pay | Admitting: Obstetrics and Gynecology

## 2010-08-25 DIAGNOSIS — R102 Pelvic and perineal pain: Secondary | ICD-10-CM

## 2010-08-25 DIAGNOSIS — N83202 Unspecified ovarian cyst, left side: Secondary | ICD-10-CM

## 2010-08-25 NOTE — Progress Notes (Signed)
Addended by: Deliah Goody on: 08/25/2010 02:09 PM   Modules accepted: Orders

## 2010-08-29 ENCOUNTER — Ambulatory Visit (HOSPITAL_COMMUNITY)
Admission: RE | Admit: 2010-08-29 | Discharge: 2010-08-29 | Disposition: A | Discharge status: Home / Self Care | Payer: Federal, State, Local not specified - PPO | Source: Ambulatory Visit | Attending: Obstetrics and Gynecology | Admitting: Obstetrics and Gynecology

## 2010-08-29 ENCOUNTER — Ambulatory Visit (HOSPITAL_COMMUNITY): Admission: RE | Admit: 2010-08-29 | Payer: Federal, State, Local not specified - PPO | Source: Ambulatory Visit

## 2010-08-29 DIAGNOSIS — N83202 Unspecified ovarian cyst, left side: Secondary | ICD-10-CM

## 2010-08-29 DIAGNOSIS — N83209 Unspecified ovarian cyst, unspecified side: Secondary | ICD-10-CM | POA: Insufficient documentation

## 2010-08-29 DIAGNOSIS — R102 Pelvic and perineal pain: Secondary | ICD-10-CM

## 2010-08-29 DIAGNOSIS — N949 Unspecified condition associated with female genital organs and menstrual cycle: Secondary | ICD-10-CM | POA: Insufficient documentation

## 2010-08-29 DIAGNOSIS — N9489 Other specified conditions associated with female genital organs and menstrual cycle: Secondary | ICD-10-CM | POA: Insufficient documentation

## 2010-08-29 MED ORDER — IOHEXOL 300 MG/ML  SOLN
100.0000 mL | Freq: Once | INTRAMUSCULAR | Status: AC | PRN
  Administered 2010-08-29: 100 mL via INTRAVENOUS

## 2010-09-06 ENCOUNTER — Encounter (HOSPITAL_COMMUNITY): Payer: Federal, State, Local not specified - PPO

## 2010-09-06 ENCOUNTER — Other Ambulatory Visit: Payer: Self-pay | Admitting: Obstetrics and Gynecology

## 2010-09-06 LAB — BASIC METABOLIC PANEL
Calcium: 9.4 mg/dL (ref 8.4–10.5)
Chloride: 98 mEq/L (ref 96–112)
Creatinine, Ser: 0.5 mg/dL (ref 0.4–1.2)
GFR calc Af Amer: >60 mL/min (ref >60)
GFR calc non Af Amer: >60 mL/min (ref >60)

## 2010-09-06 LAB — CBC
MCH: 31.2 pg (ref 26.0–34.0)
Platelets: 248 10*3/uL (ref 150–400)
RBC: 4.26 MIL/uL (ref 3.87–5.11)
RDW: 13 % (ref 11.5–15.5)

## 2010-09-06 LAB — SURGICAL PCR SCREEN
MRSA, PCR: NEGATIVE
Staphylococcus aureus: NEGATIVE

## 2010-09-13 ENCOUNTER — Other Ambulatory Visit: Payer: Self-pay | Admitting: Obstetrics and Gynecology

## 2010-09-13 ENCOUNTER — Ambulatory Visit (HOSPITAL_COMMUNITY)
Admission: RE | Admit: 2010-09-13 | Discharge: 2010-09-14 | Disposition: A | Discharge status: Home / Self Care | Payer: Federal, State, Local not specified - PPO | Source: Ambulatory Visit | Attending: Obstetrics and Gynecology | Admitting: Obstetrics and Gynecology

## 2010-09-13 DIAGNOSIS — Z01818 Encounter for other preprocedural examination: Secondary | ICD-10-CM | POA: Insufficient documentation

## 2010-09-13 DIAGNOSIS — N83209 Unspecified ovarian cyst, unspecified side: Secondary | ICD-10-CM | POA: Insufficient documentation

## 2010-09-13 DIAGNOSIS — Z01812 Encounter for preprocedural laboratory examination: Secondary | ICD-10-CM | POA: Insufficient documentation

## 2010-09-13 LAB — PREGNANCY, URINE: Preg Test, Ur: NEGATIVE

## 2010-09-14 LAB — CBC
Hemoglobin: 9.5 g/dL — ABNORMAL LOW (ref 12.0–15.0)
MCHC: 32.2 g/dL (ref 30.0–36.0)
RBC: 3.21 MIL/uL — ABNORMAL LOW (ref 3.87–5.11)
WBC: 9.3 10*3/uL (ref 4.0–10.5)

## 2010-09-14 NOTE — Op Note (Signed)
Jane Mcdonald, Jane Mcdonald              ACCOUNT NO.:  [id number]  MEDICAL RECORD NO.:  [id number]           PATIENT TYPE:  O  LOCATION:  9311                          FACILITY:  [facility]  PHYSICIAN:  Duke Salvia. Marcelle Overlie, M.D.DATE OF BIRTH:  [birth year]  DATE OF PROCEDURE:  09/13/2010 DATE OF DISCHARGE:                              OPERATIVE REPORT   PREOPERATIVE DIAGNOSIS:  Left ovarian cyst.  POSTOPERATIVE DIAGNOSIS:  Left ovarian cyst.  PROCEDURE:  Laparotomy with left salpingo-oophorectomy.  SURGEON:  Duke Salvia. Marcelle Overlie, MD  ASSISTANT:  Stann Mainland. Vincente Poli, MD  ANESTHESIA:  General endotracheal.  COMPLICATIONS:  None.  DRAINS:  Foley catheter.  BLOOD LOSS:  Minimal.  PROCEDURE AND FINDINGS:  The patient was taken to the operating room. After an adequate level of general endotracheal anesthesia was obtained with the patient in supine, the abdomen was prepped and draped in usual manner for sterile abdominal procedures.  Foley catheter positioned draining clear urine.  Transverse Pfannenstiel incision was made 2 fingerbreadths above the symphysis, carried down to the fascia which was incised and extended transversely.  Rectus muscle divided in the midline.  Peritoneum entered superiorly without incident.  This was extended vertically.  There was no free fluid noted.  The uterus, right tube and ovary were unremarkable.  The cul-de-sac was free and clear. There was a 9-cm smooth-walled cyst in the posterior cul-de-sac that was mobile.  There were no excrescences, no other abnormalities were noted. I tried to deliver the cyst through the Pfannenstiel incision which was relatively small, could not be easily delivered.  Decision made to proceed to deflate this very benign-appearing cyst.  A small incision was made.  The cyst fluid was sent separately suctioned into the suction container.  All clear fluid.  Once the cyst was deflated, it was easily elevated and Kelly clamps were then  used to clamp the distal tube and ovary, noting that the course of the ureter was well below.  This was clamped, divided and suture-ligated with 0 Vicryl suture.  Once this was removed, it was sent to pathology for permanence along with the cyst fluid for cytology.  Careful inspection revealed a small uterus.  Cul-de- sac free and clear.  The cecum and upper abdomen were unremarkable.  The remaining right ovary was small and normal.  The operative site was irrigated and examined and noted to be hemostatic.  Prior to closure, sponge, needle and instrument counts reported as correct x2.  Peritoneum closed with a 3-0 Vicryl suture, 3-0 Vicryl interrupted sutures used to approximate the rectus muscles in the midline.  A 0 PDS was then used to close the fascia from laterally to midline on either side.  Prior to completely closing the fascia, a 6-inch needle introducer was then used to introduce a On-Q catheter into the subfascial space.  A separate introducer was used to locate another catheter into the subcutaneous space.  The fascia was then closed the remainder of the way with PDS.  Subcutaneous tissue was hemostatic, a 4-0 subcuticular skin closure with a sterile dressing. The On-Q catheters were then primed with 4-5 mL of 1% Xylocaine  at each site and connected to the Marcaine On-Q pump.  She tolerated this well, went to recovery room in good condition.     [name] M. Marcelle Overlie, M.D.     [facility]  D:  09/13/2010  T:  09/13/2010  Job:  [id number]  Electronically Signed by Richarda Overlie M.D. on 09/14/2010 09:28:46 AM

## 2010-09-14 NOTE — H&P (Signed)
  Jane Mcdonald, Jane Mcdonald              ACCOUNT NO.:  [id number]  MEDICAL RECORD NO.:  [id number]           PATIENT TYPE:  LOCATION:                                 FACILITY:  PHYSICIAN:  Duke Salvia. Marcelle Overlie, M.D.DATE OF BIRTH:  [birth year]  DATE OF ADMISSION: DATE OF DISCHARGE:                             HISTORY & PHYSICAL   DATE OF SCHEDULED SURGERY:  Sep 13, 2010  CHIEF COMPLAINT:  Ovarian cyst.  HISTORY OF PRESENT ILLNESS:  A 55 year old G 0 P 0 perimenopausal abstinent patient who was originally evaluated by Dr. Henderson Cloud for pelvic pressure and pain and an ultrasound demonstrated a simple appearing benign ovarian cyst.  CA-125 was done, which returned 8.  As part of her further evaluation once I saw her, she had CT done on August 29, 2010, which showed bowel and appendix was normal.  Uterus normal in parents. Small right ovarian follicles noted in the left adnexa extending to the midline posterior.  Cystic mass with possible thin internal separations 9.4 x 9.3 x 8.6.  No free fluid.  No pelvic adenopathy noted.  We discussed the possibility that this could be an LMP or malignant tumor or that certainly appears to be benign by all criteria.  She prefers to proceed with laparotomy with conservative LSO.  Does not want to consider hysterectomy or TAH-BSO at this time.  The surgery including risks related to bleeding, infection, transfusion, adjacent organ injury along with her expected recovery time or the need for additional surgery depending on the pathology review with her, which she understands and accepts.  ALLERGIES:  None.  CURRENT MEDICATIONS: 1. Valtrex daily. 2. Benicar 10 mg daily.  REVIEW OF SYSTEMS:  Significant for history of HSV and essential hypertension.  PAST SURGICAL HISTORY:  She had excision of Bartholin gland in the 80s and one prior D and C along with tubal in 1998.  REVIEW OF SYSTEMS:  Significant for history of unspecified cancer, hypertension,  kidney disease, gallbladder disease, heart disease and asthma.  SOCIAL HISTORY:  Denies drug or tobacco use.  She does consume 2 alcoholic drinks per week.  She is single.  Her medical doctor is Dr. Oliver Barre.  PHYSICAL EXAMINATION:  VITAL SIGNS:  Temperature 98.2, blood pressure 150/90. HEENT: Unremarkable. NECK:  Supple without masses. LUNGS:  Clear. CARDIOVASCULAR:  Rate and rhythm without murmurs, rubs, or gallops. BREASTS:  Without masses. ABDOMEN:  Soft, flat, nontender. PELVIC:  Normal external genitalia.  Vagina and cervix clear.  Uterus mid position, right adnexa negative.  In the left, there is cystic mass 7 to 8 cm, only minimally tender. EXTREMITIES:  Unremarkable. NEUROLOGIC:  Unremarkable.  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  PLAN:  Laparotomy with USO.  Procedure and risks discussed as above.     Jane Mcdonald M. Marcelle Overlie, M.D.     [facility]  D:  09/12/2010  T:  09/12/2010  Job:  [id number]  Electronically Signed by Richarda Overlie M.D. on 09/14/2010 09:28:43 AM

## 2010-09-20 NOTE — Discharge Summary (Signed)
  Jane Mcdonald, Jane Mcdonald              ACCOUNT NO.:  [id number]  MEDICAL RECORD NO.:  [id number]           PATIENT TYPE:  O  LOCATION:  9311                          FACILITY:  [facility]  PHYSICIAN:  Duke Salvia. Marcelle Overlie, M.D.DATE OF BIRTH:  26-Nov-1955  DATE OF ADMISSION:  09/13/2010 DATE OF DISCHARGE:  09/14/2010                              DISCHARGE SUMMARY   DISCHARGE DIAGNOSES: 1. Left lower quadrant pain. 2. Left ovarian cyst. 3. Laparotomy with left salpingo-oophorectomy this admission.  SUMMARY OF HISTORY AND PHYSICAL EXAMINATION:  Please see admission H and P for details.  Briefly, this is a 55 year old with left lower quadrant pain who was noted to have by ultrasound and CT scanning a 9-10 cm left ovarian cyst who presents for LSO.  HOSPITAL COURSE:  On Sep 13, 2010 under general anesthesia, the patient underwent LSO what appeared to be a very benign cyst and was sent for permanent sections.  On-Q catheters were positioned for pain relief. She did very well overnight.  Her catheter was removed.  She was voiding without difficulty on postop day #1.  The On-Q catheters were removed intact x2.  The incision was clean and dry.  Abdominal exam was unremarkable.  She was afebrile and ready for discharge at her request the next morning.  LABORATORY DATA:  UPT was negative.  Preop CBC on Sep 06, 2010, WBC 8.7, hemoglobin 13.3, and platelet count 248,000.  Postop CBC on Sep 14, 2010, WBC 9300, hemoglobin 9.5, and hematocrit 29.5.  DISPOSITION:  The patient is discharged on both Motrin 800 mg p.o. q.8- 12 h. p.r.n. pain, Tylox #20 one or two p.o. q.4-6 h p.r.n. pain.  Will return to the office in 7 days to have the incision examined.  Advised to report any incisional redness or drainage, increased pain or bleeding or fever over 101.  She was given specific instructions regarding diet, sex, and exercise.  CONDITION:  Good.  ACTIVITY:  Graded increase.     Jane Mcdonald M. Marcelle Overlie,  M.D.     [facility]  D:  09/14/2010  T:  09/14/2010  Job:  [id number]  Electronically Signed by Richarda Overlie M.D. on 09/20/2010 08:51:32 AM

## 2010-09-22 ENCOUNTER — Encounter: Payer: Federal, State, Local not specified - PPO | Admitting: Cardiology

## 2010-09-29 ENCOUNTER — Encounter (INDEPENDENT_AMBULATORY_CARE_PROVIDER_SITE_OTHER): Payer: Federal, State, Local not specified - PPO | Admitting: *Deleted

## 2010-09-29 ENCOUNTER — Other Ambulatory Visit: Payer: Self-pay | Admitting: *Deleted

## 2010-09-29 DIAGNOSIS — R0989 Other specified symptoms and signs involving the circulatory and respiratory systems: Secondary | ICD-10-CM

## 2010-09-30 ENCOUNTER — Encounter: Payer: Self-pay | Admitting: Cardiovascular Disease

## 2011-01-26 ENCOUNTER — Other Ambulatory Visit (INDEPENDENT_AMBULATORY_CARE_PROVIDER_SITE_OTHER): Payer: Federal, State, Local not specified - PPO

## 2011-01-26 DIAGNOSIS — Z0000 Encounter for general adult medical examination without abnormal findings: Secondary | ICD-10-CM

## 2011-01-26 LAB — HEPATIC FUNCTION PANEL
ALT: 11 U/L (ref 0–35)
Total Bilirubin: 0.6 mg/dL (ref 0.3–1.2)

## 2011-01-26 LAB — URINALYSIS, ROUTINE W REFLEX MICROSCOPIC
Nitrite: NEGATIVE
Total Protein, Urine: NEGATIVE
pH: 6.5 (ref 5.0–8.0)

## 2011-01-26 LAB — CBC WITH DIFFERENTIAL/PLATELET
Basophils Relative: 0.3 % (ref 0.0–3.0)
Eosinophils Relative: 1.9 % (ref 0.0–5.0)
Hemoglobin: 12.3 g/dL (ref 12.0–15.0)
Lymphocytes Relative: 24.7 % (ref 12.0–46.0)
MCV: 91.7 fl (ref 78.0–100.0)
Neutrophils Relative %: 64.7 % (ref 43.0–77.0)
RBC: 4.05 Mil/uL (ref 3.87–5.11)
WBC: 6.3 10*3/uL (ref 4.5–10.5)

## 2011-01-26 LAB — BASIC METABOLIC PANEL
BUN: 11 mg/dL (ref 6–23)
Calcium: 8.4 mg/dL (ref 8.4–10.5)
Chloride: 107 mEq/L (ref 96–112)
Creatinine, Ser: 0.6 mg/dL (ref 0.4–1.2)

## 2011-01-26 LAB — LIPID PANEL
Cholesterol: 128 mg/dL (ref 0–200)
HDL: 56.2 mg/dL (ref >39.00)
LDL Cholesterol: 61 mg/dL (ref 0–99)
Triglycerides: 56 mg/dL (ref 0.0–149.0)
VLDL: 11.2 mg/dL (ref 0.0–40.0)

## 2011-01-31 ENCOUNTER — Ambulatory Visit (INDEPENDENT_AMBULATORY_CARE_PROVIDER_SITE_OTHER): Payer: Federal, State, Local not specified - PPO | Admitting: Internal Medicine

## 2011-01-31 ENCOUNTER — Encounter: Payer: Self-pay | Admitting: Internal Medicine

## 2011-01-31 VITALS — BP 104/70 | HR 74 | Temp 98.6°F | Ht 66.0 in | Wt 138.2 lb

## 2011-01-31 DIAGNOSIS — Z0000 Encounter for general adult medical examination without abnormal findings: Secondary | ICD-10-CM

## 2011-01-31 DIAGNOSIS — Z23 Encounter for immunization: Secondary | ICD-10-CM

## 2011-01-31 NOTE — Patient Instructions (Addendum)
You had the flu shot today You should be due for followup renal duplex scan and Dr Excell Seltzer evaluation approx August 21, 2011 Continue all other medications as before Please have the pharmacy call if you need refills Please call if you would like to schedule the colonoscopy Please return in 1 year for your yearly visit, or sooner if needed, with Lab testing done 3-5 days before

## 2011-01-31 NOTE — Progress Notes (Signed)
Subjective:    Patient ID: Jane Mcdonald, female    DOB: August 13, 1955, 55 y.o.   MRN: [id number]  HPI  Here for wellness and f/u;  Overall doing ok;  Pt denies CP, worsening SOB, DOE, wheezing, orthopnea, PND, worsening LE edema, palpitations, dizziness or syncope.  Pt denies neurological change such as new Headache, facial or extremity weakness.  Pt denies polydipsia, polyuria, or low sugar symptoms. Pt states overall good compliance with treatment and medications, good tolerability, and trying to follow lower cholesterol diet.  Pt denies worsening depressive symptoms, suicidal ideation or panic. No fever, wt loss, night sweats, loss of appetite, or other constitutional symptoms.  Pt states good ability with ADL's, low fall risk, home safety reviewed and adequate, no significant changes in hearing or vision, and occasionally active with exercise.  Denies worsening depressive symptoms, suicidal ideation, or panic, though has ongoing anxiety, not increased recently.  Past Medical History  Diagnosis Date  . HYPERTENSION 07/14/2010  . Renal artery stenosis, native, bilateral 07/28/2010  . FOOT PAIN, BILATERAL 03/30/2010  . SMOKER 07/14/2010  . ANXIETY, SITUATIONAL 07/14/2010  . Glaucoma     sees optho/Dr. Dione Booze every 6 months  . Rosacea   . Palpitations    Past Surgical History  Procedure Date  . Dilation and curettage of uterus     s/p  . Tubal ligation     reports that she has never smoked. She does not have any smokeless tobacco history on file. Her alcohol and drug histories not on file. family history includes Cancer in her other; Heart disease in her father; Hypertension in her mother; and Nephrolithiasis in her father. No Known Allergies Current Outpatient Prescriptions on File Prior to Visit  Medication Sig Dispense Refill  . Cholecalciferol (VITAMIN D-3 PO) Take by mouth daily.        Marland Kitchen latanoprost (XALATAN) 0.005 % ophthalmic solution 1 drop. Use as directed       . lisinopril  (PRINIVIL,ZESTRIL) 20 MG tablet Take 1 tablet (20 mg total) by mouth daily.  30 tablet  11  . valACYclovir (VALTREX) 500 MG tablet Take 500 mg by mouth daily.         Review of Systems Review of Systems  Constitutional: Negative for diaphoresis, activity change, appetite change and unexpected weight change.  HENT: Negative for hearing loss, ear pain, facial swelling, mouth sores and neck stiffness.   Eyes: Negative for pain, redness and visual disturbance.  Respiratory: Negative for shortness of breath and wheezing.   Cardiovascular: Negative for chest pain and palpitations.  Gastrointestinal: Negative for diarrhea, blood in stool, abdominal distention and rectal pain.  Genitourinary: Negative for hematuria, flank pain and decreased urine volume.  Musculoskeletal: Negative for myalgias and joint swelling.  Skin: Negative for color change and wound.  Neurological: Negative for syncope and numbness.  Hematological: Negative for adenopathy.  Psychiatric/Behavioral: Negative for hallucinations, self-injury, decreased concentration and agitation.     Objective:   Physical Exam BP 104/70  Pulse 74  Temp(Src) 98.6 F (37 C) (Oral)  Ht 5\' 6"  (1.676 m)  Wt 138 lb 4 oz (62.71 kg)  BMI 22.31 kg/m2  SpO2 98% Physical Exam  VS noted Constitutional: Pt is oriented to person, place, and time. Appears well-developed and well-nourished.  HENT:  Head: Normocephalic and atraumatic.  Right Ear: External ear normal.  Left Ear: External ear normal.  Nose: Nose normal.  Mouth/Throat: Oropharynx is clear and moist.  Eyes: Conjunctivae and EOM are normal. Pupils are equal,  round, and reactive to light.  Neck: Normal range of motion. Neck supple. No JVD present. No tracheal deviation present.  Cardiovascular: Normal rate, regular rhythm, normal heart sounds and intact distal pulses.   Pulmonary/Chest: Effort normal and breath sounds normal.  Abdominal: Soft. Bowel sounds are normal. There is no  tenderness.  Musculoskeletal: Normal range of motion. Exhibits no edema.  Lymphadenopathy:  Has no cervical adenopathy.  Neurological: Pt is alert and oriented to person, place, and time. Pt has normal reflexes. No cranial nerve deficit.  Skin: Skin is warm and dry. No rash noted.  Psychiatric:  Has  normal mood and affect. Behavior is normal.     Assessment & Plan:

## 2011-02-03 ENCOUNTER — Encounter: Payer: Self-pay | Admitting: Internal Medicine

## 2011-02-05 ENCOUNTER — Encounter: Payer: Self-pay | Admitting: Internal Medicine

## 2011-02-05 MED ORDER — LISINOPRIL 20 MG PO TABS: 10.0000 mg | ORAL_TABLET | Freq: Every day | ORAL | Status: DC

## 2011-02-05 NOTE — Assessment & Plan Note (Signed)

## 2011-08-23 ENCOUNTER — Encounter: Payer: Self-pay | Admitting: *Deleted

## 2011-08-23 ENCOUNTER — Ambulatory Visit (INDEPENDENT_AMBULATORY_CARE_PROVIDER_SITE_OTHER): Payer: Federal, State, Local not specified - PPO | Admitting: Cardiovascular Disease

## 2011-08-23 ENCOUNTER — Encounter: Payer: Self-pay | Admitting: Cardiovascular Disease

## 2011-08-23 VITALS — BP 144/84 | HR 75 | Ht 66.0 in | Wt 132.0 lb

## 2011-08-23 DIAGNOSIS — I10 Essential (primary) hypertension: Secondary | ICD-10-CM

## 2011-08-23 DIAGNOSIS — I701 Atherosclerosis of renal artery: Secondary | ICD-10-CM

## 2011-08-23 NOTE — Patient Instructions (Signed)
Your physician wants you to follow-up in: 12 months.  You will receive a reminder letter in the mail two months in advance. If you don't receive a letter, please call our office to schedule the follow-up appointment.  Your physician has requested that you have a renal artery duplex. During this test, an ultrasound is used to evaluate blood flow to the kidneys. Allow one hour for this exam. Do not eat after midnight the day before and avoid carbonated beverages. Take your medications as you usually do.  

## 2011-08-24 ENCOUNTER — Encounter: Payer: Self-pay | Admitting: Cardiovascular Disease

## 2011-08-24 NOTE — Progress Notes (Signed)
   HPI:  56 year-old woman with HTN and renal artery fibromuscular dysplasia. She presents for follow-up evaluation today. She has been doing well overall. Complains of mild palpitations at night. No associated symptoms. No lightheadedness, syncope, CP, dyspnea, or other complaints. She reports good control of her BP at home and at prior office visits.   Renal duplex last year showed typical findings of FMD. Carotid duplex suggested normal carotids without evidence of carotid FMD.  Outpatient Encounter Prescriptions as of 08/23/2011  Medication Sig Dispense Refill  . latanoprost (XALATAN) 0.005 % ophthalmic solution 1 drop. Use as directed      . lisinopril (PRINIVIL,ZESTRIL) 20 MG tablet Take 0.5 tablets (10 mg total) by mouth daily.  30 tablet  11  . valACYclovir (VALTREX) 500 MG tablet Take 500 mg by mouth daily.        Marland Kitchen DISCONTD: Cholecalciferol (VITAMIN D-3 PO) Take by mouth daily.          No Known Allergies  Past Medical History  Diagnosis Date  . HYPERTENSION 07/14/2010  . Renal artery stenosis, native, bilateral 07/28/2010  . FOOT PAIN, BILATERAL 03/30/2010  . SMOKER 07/14/2010  . ANXIETY, SITUATIONAL 07/14/2010  . Glaucoma     sees optho/Dr. Dione Booze every 6 months  . Rosacea   . Palpitations     ROS: Negative except as per HPI  BP 144/84  Pulse 75  Ht 5\' 6"  (1.676 m)  Wt 59.875 kg (132 lb)  BMI 21.31 kg/m2  PHYSICAL EXAM: Pt is alert and oriented, healthy appearing woman in NAD HEENT: normal Neck: JVP - normal, carotids 2+= without bruits Lungs: CTA bilaterally CV: RRR without murmur or gallop Abd: soft, NT, Positive BS, no hepatomegaly, no abdominal bruits Ext: no C/C/E, distal pulses intact and equal Skin: warm/dry no rash  EKG:  NSR 75 bpm, rightward axis, otherwise within normal limits  ASSESSMENT AND PLAN:

## 2011-08-24 NOTE — Assessment & Plan Note (Signed)
Mild BP elevation today. Asked patient to record home BP readings and continue lisinopril 10 mg daily. She will have a repeat renal duplex to rule out progressive stenosis related to FMD. Noted to have normal renal function and controlled BP on monotherapy, so not inclined to proceed with renal PTA. Will follow-up in one year.

## 2011-09-13 ENCOUNTER — Encounter (INDEPENDENT_AMBULATORY_CARE_PROVIDER_SITE_OTHER): Payer: Federal, State, Local not specified - PPO

## 2011-09-13 DIAGNOSIS — I10 Essential (primary) hypertension: Secondary | ICD-10-CM

## 2011-09-13 DIAGNOSIS — I700 Atherosclerosis of aorta: Secondary | ICD-10-CM

## 2011-09-26 ENCOUNTER — Telehealth: Payer: Self-pay | Admitting: Cardiovascular Disease

## 2011-09-26 NOTE — Telephone Encounter (Signed)
Per Dr Excell Seltzer:  Renal duplex was stable from previous. Right renal artery stenosis noted with changes consistent with FMD. Cont med management since BP well-controlled and renal fxn stable   I spoke with the pt and made her aware of renal duplex results.

## 2011-09-26 NOTE — Telephone Encounter (Signed)
New msg Pt was calling about test results. She hasnt heard anything. Please call

## 2012-01-25 ENCOUNTER — Other Ambulatory Visit (INDEPENDENT_AMBULATORY_CARE_PROVIDER_SITE_OTHER): Payer: Federal, State, Local not specified - PPO

## 2012-01-25 DIAGNOSIS — Z0000 Encounter for general adult medical examination without abnormal findings: Secondary | ICD-10-CM

## 2012-01-25 LAB — URINALYSIS, ROUTINE W REFLEX MICROSCOPIC
Bilirubin Urine: NEGATIVE
Leukocytes, UA: NEGATIVE
Urobilinogen, UA: 0.2 (ref 0.0–1.0)

## 2012-01-25 LAB — BASIC METABOLIC PANEL
CO2: 27 mEq/L (ref 19–32)
Chloride: 103 mEq/L (ref 96–112)
Glucose, Bld: 88 mg/dL (ref 70–99)
Sodium: 137 mEq/L (ref 135–145)

## 2012-01-25 LAB — LIPID PANEL
HDL: 62.2 mg/dL (ref >39.00)
LDL Cholesterol: 58 mg/dL (ref 0–99)
Total CHOL/HDL Ratio: 2
Triglycerides: 45 mg/dL (ref 0.0–149.0)

## 2012-01-25 LAB — HEPATIC FUNCTION PANEL
Albumin: 4.1 g/dL (ref 3.5–5.2)
Bilirubin, Direct: 0.1 mg/dL (ref 0.0–0.3)

## 2012-01-25 LAB — CBC WITH DIFFERENTIAL/PLATELET
Basophils Relative: 0.4 % (ref 0.0–3.0)
Eosinophils Relative: 1.2 % (ref 0.0–5.0)
HCT: 38.4 % (ref 36.0–46.0)
MCV: 92.8 fl (ref 78.0–100.0)
Monocytes Absolute: 0.5 10*3/uL (ref 0.1–1.0)
Neutrophils Relative %: 70.6 % (ref 43.0–77.0)
RBC: 4.14 Mil/uL (ref 3.87–5.11)
WBC: 6.8 10*3/uL (ref 4.5–10.5)

## 2012-02-01 ENCOUNTER — Ambulatory Visit (INDEPENDENT_AMBULATORY_CARE_PROVIDER_SITE_OTHER): Payer: Federal, State, Local not specified - PPO | Admitting: Internal Medicine

## 2012-02-01 ENCOUNTER — Encounter: Payer: Self-pay | Admitting: Internal Medicine

## 2012-02-01 VITALS — BP 124/78 | HR 69 | Temp 97.7°F | Ht 66.0 in | Wt 126.1 lb

## 2012-02-01 DIAGNOSIS — H409 Unspecified glaucoma: Secondary | ICD-10-CM

## 2012-02-01 DIAGNOSIS — Z23 Encounter for immunization: Secondary | ICD-10-CM

## 2012-02-01 DIAGNOSIS — Z0000 Encounter for general adult medical examination without abnormal findings: Secondary | ICD-10-CM

## 2012-02-01 DIAGNOSIS — I10 Essential (primary) hypertension: Secondary | ICD-10-CM

## 2012-02-01 DIAGNOSIS — Z87891 Personal history of nicotine dependence: Secondary | ICD-10-CM | POA: Insufficient documentation

## 2012-02-01 HISTORY — DX: Unspecified glaucoma: H40.9

## 2012-02-01 MED ORDER — ASPIRIN 81 MG PO TBEC: 81.0000 mg | DELAYED_RELEASE_TABLET | Freq: Every day | ORAL | Status: AC

## 2012-02-01 MED ORDER — LISINOPRIL 20 MG PO TABS: 20.0000 mg | ORAL_TABLET | Freq: Every day | ORAL | Status: DC

## 2012-02-01 NOTE — Patient Instructions (Addendum)
You had the flu shot today Your lisinopril is increased to 20 mg per day  Continue all other medications as before Please remember to followup with your GYN for the yearly pap smear and/or mammogram Please continue your plan for the colonoscopy You are otherwise up to date with prevention Please keep your appointments with your specialists as you have planned - opthomology, and Dr Excell Seltzer Please return in 1 year for your yearly visit, or sooner if needed, with Lab testing done 3-5 days before Please remember to sign up for My Chart at your earliest convenience, as this will be important to you in the future with finding out test results.

## 2012-02-01 NOTE — Assessment & Plan Note (Signed)

## 2012-02-01 NOTE — Progress Notes (Signed)
Subjective:    Patient ID: Jane Mcdonald, female    DOB: 09-Dec-1955, 56 y.o.   MRN: [id number]  HPI  Here for wellness and f/u;  Overall doing ok;  Pt denies CP, worsening SOB, DOE, wheezing, orthopnea, PND, worsening LE edema, palpitations, dizziness or syncope.  Pt denies neurological change such as new Headache, facial or extremity weakness.  Pt denies polydipsia, polyuria, or low sugar symptoms. Pt states overall good compliance with treatment and medications, good tolerability, and trying to follow lower cholesterol diet.  Pt denies worsening depressive symptoms, suicidal ideation or panic. No fever, wt loss, night sweats, loss of appetite, or other constitutional symptoms.  Pt states good ability with ADL's, low fall risk, home safety reviewed and adequate, no significant changes in hearing or vision, and occasionally active with exercise. BP at GYN elev several times, now on 20 mg lisinopril, doing well since.  No acute illness. For flu shot today Past Medical History  Diagnosis Date  . HYPERTENSION 07/14/2010  . Renal artery stenosis, native, bilateral 07/28/2010  . FOOT PAIN, BILATERAL 03/30/2010  . SMOKER 07/14/2010  . ANXIETY, SITUATIONAL 07/14/2010  . Glaucoma     sees optho/Dr. Dione Booze every 6 months  . Rosacea   . Palpitations   . Glaucoma (increased eye pressure) 02/01/2012   Past Surgical History  Procedure Date  . Dilation and curettage of uterus     s/p  . Tubal ligation   . Oophorectomy     left only 2012, non malignant mass    reports that she has never smoked. She does not have any smokeless tobacco history on file. Her alcohol and drug histories not on file. family history includes Cancer in her other; Heart disease in her father; Hypertension in her mother; and Nephrolithiasis in her father. No Known Allergies Current Outpatient Prescriptions on File Prior to Visit  Medication Sig Dispense Refill  . latanoprost (XALATAN) 0.005 % ophthalmic solution 1 drop. Use as directed       . valACYclovir (VALTREX) 500 MG tablet Take 500 mg by mouth daily.        Marland Kitchen DISCONTD: lisinopril (PRINIVIL,ZESTRIL) 20 MG tablet Take 0.5 tablets (10 mg total) by mouth daily.  30 tablet  11   Review of Systems Review of Systems  Constitutional: Negative for diaphoresis, activity change, appetite change and unexpected weight change.  HENT: Negative for hearing loss, ear pain, facial swelling, mouth sores and neck stiffness.   Eyes: Negative for pain, redness and visual disturbance.  Respiratory: Negative for shortness of breath and wheezing.   Cardiovascular: Negative for chest pain and palpitations.  Gastrointestinal: Negative for diarrhea, blood in stool, abdominal distention and rectal pain.  Genitourinary: Negative for hematuria, flank pain and decreased urine volume.  Musculoskeletal: Negative for myalgias and joint swelling.  Skin: Negative for color change and wound.  Neurological: Negative for syncope and numbness.  Hematological: Negative for adenopathy.  Psychiatric/Behavioral: Negative for hallucinations, self-injury, decreased concentration and agitation.      Objective:   Physical Exam BP 124/78  Pulse 69  Temp 97.7 F (36.5 C) (Oral)  Ht 5\' 6"  (1.676 m)  Wt 126 lb 2 oz (57.21 kg)  BMI 20.36 kg/m2  SpO2 98% Physical Exam  VS noted Constitutional: Pt is oriented to person, place, and time. Appears well-developed and well-nourished.  HENT:  Head: Normocephalic and atraumatic.  Right Ear: External ear normal.  Left Ear: External ear normal.  Nose: Nose normal.  Mouth/Throat: Oropharynx is clear and moist.  Eyes: Conjunctivae and EOM are normal. Pupils are equal, round, and reactive to light.  Neck: Normal range of motion. Neck supple. No JVD present. No tracheal deviation present.  Cardiovascular: Normal rate, regular rhythm, normal heart sounds and intact distal pulses.   Pulmonary/Chest: Effort normal and breath sounds normal.  Abdominal: Soft. Bowel sounds  are normal. There is no tenderness.  Musculoskeletal: Normal range of motion. Exhibits no edema.  Lymphadenopathy:  Has no cervical adenopathy.  Neurological: Pt is alert and oriented to person, place, and time. Pt has normal reflexes. No cranial nerve deficit.  Skin: Skin is warm and dry. No rash noted.  Psychiatric:  Has  normal mood and affect. Behavior is normal.     Assessment & Plan:

## 2012-02-01 NOTE — Assessment & Plan Note (Signed)
stable overall by hx and exam, most recent data reviewed with pt, and pt to continue medical treatment as before - the 20 mg ACE BP Readings from Last 3 Encounters:  02/01/12 124/78  08/23/11 144/84  01/31/11 104/70

## 2012-02-19 ENCOUNTER — Encounter: Payer: Self-pay | Admitting: Internal Medicine

## 2012-02-19 LAB — HM MAMMOGRAPHY: HM Mammogram: NEGATIVE

## 2012-07-04 ENCOUNTER — Telehealth: Payer: Self-pay | Admitting: Cardiovascular Disease

## 2012-07-04 DIAGNOSIS — I701 Atherosclerosis of renal artery: Secondary | ICD-10-CM

## 2012-07-04 NOTE — Telephone Encounter (Signed)
Pt has April recall for fu with cooper, requesting she have another renal artery duplex, can she get an order?

## 2012-07-04 NOTE — Telephone Encounter (Signed)
Last renal art dup done 09/18/11  Stable renal stenosis at 60% blockage. Pt informed dr/nurse out of office and will get a call back next week, pt agreed to plan. Will forward to dr/nurse to review.

## 2012-07-05 NOTE — Telephone Encounter (Signed)
Yes. Renal arterial duplex prior to office visit. thx

## 2012-07-10 NOTE — Telephone Encounter (Signed)
Pt scheduled for renal duplex on 09/06/12.

## 2012-07-30 ENCOUNTER — Ambulatory Visit: Payer: Federal, State, Local not specified - PPO | Admitting: Cardiovascular Disease

## 2012-09-06 ENCOUNTER — Encounter: Payer: Self-pay | Admitting: Cardiovascular Disease

## 2012-09-06 ENCOUNTER — Ambulatory Visit (INDEPENDENT_AMBULATORY_CARE_PROVIDER_SITE_OTHER): Payer: Federal, State, Local not specified - PPO | Admitting: Cardiovascular Disease

## 2012-09-06 ENCOUNTER — Encounter (INDEPENDENT_AMBULATORY_CARE_PROVIDER_SITE_OTHER): Payer: Federal, State, Local not specified - PPO

## 2012-09-06 VITALS — BP 110/70 | HR 64 | Ht 64.0 in | Wt 125.0 lb

## 2012-09-06 DIAGNOSIS — I10 Essential (primary) hypertension: Secondary | ICD-10-CM

## 2012-09-06 DIAGNOSIS — I773 Arterial fibromuscular dysplasia: Secondary | ICD-10-CM

## 2012-09-06 DIAGNOSIS — I701 Atherosclerosis of renal artery: Secondary | ICD-10-CM

## 2012-09-06 DIAGNOSIS — I7789 Other specified disorders of arteries and arterioles: Secondary | ICD-10-CM

## 2012-09-06 NOTE — Progress Notes (Signed)
   HPI:  57 year-old woman presenting for follow-up evaluation. She has renal FMD. We have evaluated her carotids and there is no evidence of carotid FMD. She has no complaints today. Had palpitations last year but no longer a problem. No chest pain, dyspnea, or edema.  Outpatient Encounter Prescriptions as of 09/06/2012  Medication Sig Dispense Refill  . aspirin 81 MG EC tablet Take 1 tablet (81 mg total) by mouth daily. Swallow whole.  30 tablet  12  . latanoprost (XALATAN) 0.005 % ophthalmic solution 1 drop. Use as directed      . lisinopril (PRINIVIL,ZESTRIL) 20 MG tablet Take 1 tablet (20 mg total) by mouth daily.  90 tablet  3  . valACYclovir (VALTREX) 500 MG tablet Take 500 mg by mouth daily.         No facility-administered encounter medications on file as of 09/06/2012.    No Known Allergies  Past Medical History  Diagnosis Date  . HYPERTENSION 07/14/2010  . Renal artery stenosis, native, bilateral 07/28/2010  . FOOT PAIN, BILATERAL 03/30/2010  . SMOKER 07/14/2010  . ANXIETY, SITUATIONAL 07/14/2010  . Glaucoma(365)     sees optho/Dr. Dione Booze every 6 months  . Rosacea   . Palpitations   . Glaucoma (increased eye pressure) 02/01/2012    ROS: Negative except as per HPI  BP 110/70  Pulse 64  Ht 5\' 4"  (1.626 m)  Wt 56.7 kg (125 lb)  BMI 21.45 kg/m2  SpO2 99%  PHYSICAL EXAM: Pt is alert and oriented, NAD HEENT: normal Neck: JVP - normal, carotids 2+= without bruits Lungs: CTA bilaterally CV: RRR without murmur or gallop Abd: soft, NT, Positive BS, no hepatomegaly Ext: no C/C/E, distal pulses intact and equal Skin: warm/dry no rash  EKG:  NSR< within normal limits  Renal arterial doppler: Stable >60% stenosis on the right, no significant stenosis on the left. Beading noted suggestive of FMD. Right kidney 10.1 cm, left 12.2 cm.  ASSESSMENT AND PLAN: 1. Renal artery FMD - doing well with good BP control on lisinopril. Renal function normal. No indication for intervention.  Since there is size asymmetry in the kidneys, will repeat her duplex next year.   2. HTN - well-controlled. Continue lisinopril.  Will follow-up in 12 months unless problems arise.  Tonny Bollman 09/06/2012 11:31 AM

## 2012-09-06 NOTE — Patient Instructions (Signed)
Your physician wants you to follow-up in: 1 YEAR with Dr Cooper.  You will receive a reminder letter in the mail two months in advance. If you don't receive a letter, please call our office to schedule the follow-up appointment.  Your physician has requested that you have a renal artery duplex in 1 YEAR. During this test, an ultrasound is used to evaluate blood flow to the kidneys. Allow one hour for this exam. Do not eat after midnight the day before and avoid carbonated beverages. Take your medications as you usually do.  Your physician recommends that you continue on your current medications as directed. Please refer to the Current Medication list given to you today.  

## 2012-11-25 ENCOUNTER — Ambulatory Visit (INDEPENDENT_AMBULATORY_CARE_PROVIDER_SITE_OTHER): Payer: Federal, State, Local not specified - PPO | Admitting: Sports Medicine

## 2012-11-25 ENCOUNTER — Encounter: Payer: Self-pay | Admitting: Sports Medicine

## 2012-11-25 VITALS — BP 121/75 | HR 73 | Ht 64.0 in | Wt 125.0 lb

## 2012-11-25 DIAGNOSIS — M216X9 Other acquired deformities of unspecified foot: Secondary | ICD-10-CM

## 2012-11-25 DIAGNOSIS — M7740 Metatarsalgia, unspecified foot: Secondary | ICD-10-CM

## 2012-11-25 DIAGNOSIS — M7750 Other enthesopathy of unspecified foot: Secondary | ICD-10-CM

## 2012-11-25 NOTE — Progress Notes (Signed)
  Subjective:    Patient ID: Jane Mcdonald, female    DOB: [birth year], 57 y.o.   MRN: [id number]  HPI  chief complaint: Right foot pain  Very pleasant 57 year old female comes in today complaining of right foot pain. She localizes her pain to the plantar aspect of her forefoot. Majority of her pain is in the area of the second metatarsal head. Been present now for about 6 weeks. She has a history of plantar fasciitis and metatarsalgia and in fact had custom orthotics made at this office a little over 2 years ago. She denies any pain across the dorsum of her foot. She denies any swelling. No numbness or tingling. Symptoms are most noticeable with walking or standing for long periods of time. No injury. No pain more proximally in her ankle. Her previous orthotics had a metatarsal cookie placed on the right orthotic. She recently removed it thinking that that might be the source of her pain. Inspection of that orthotic shows the metatarsal cookie to be somewhat laterally located.  Interim medical history is unchanged. She takes lisinopril for hypertension No known drug allergies    Review of Systems     Objective:   Physical Exam Well-developed, well-nourished. No acute distress. Awake alert and oriented x3. Vital signs are reviewed  Examination of each of her feet in the standing position shows collapse of the transverse arch is bilaterally. Moderate bunion deformity of the right foot. She is tender to palpation over the second metatarsal head on the right. No tenderness to palpation across the dorsum of the foot. No swelling. Good dorsalis pedis and posterior tibial pulses. Patient walks without a limp.       Assessment & Plan:  1. Right foot pain secondary to metatarsalgia 2. Transverse arch collapse, bilateral feet  Patient was constructed a new pair orthotics today. A well-positioned a metatarsal pad on the right orthotic alleviated her discomfort. Patient was able to angulate  comfortably in her orthotics prior to leaving. If symptoms persist or worsen she will let me know. Otherwise, followup when necessary.  Patient was fitted for a : standard, cushioned, semi-rigid orthotic. The orthotic was heated and afterward the patient stood on the orthotic blank positioned on the orthotic stand. The patient was positioned in subtalar neutral position and 10 degrees of ankle dorsiflexion in a weight bearing stance. After completion of molding, a stable base was applied to the orthotic blank. The blank was ground to a stable position for weight bearing. Size:8 Base: Blue EVA Posting: none Additional orthotic padding: Right metatarsal pad

## 2013-01-31 ENCOUNTER — Other Ambulatory Visit (INDEPENDENT_AMBULATORY_CARE_PROVIDER_SITE_OTHER): Payer: Federal, State, Local not specified - PPO

## 2013-01-31 DIAGNOSIS — Z0000 Encounter for general adult medical examination without abnormal findings: Secondary | ICD-10-CM

## 2013-01-31 LAB — BASIC METABOLIC PANEL
BUN: 9 mg/dL (ref 6–23)
CO2: 29 mEq/L (ref 19–32)
Creatinine, Ser: 0.6 mg/dL (ref 0.4–1.2)
GFR: 102.36 mL/min (ref >60.00)
Glucose, Bld: 90 mg/dL (ref 70–99)
Potassium: 5 mEq/L (ref 3.5–5.1)

## 2013-01-31 LAB — URINALYSIS, ROUTINE W REFLEX MICROSCOPIC
Bilirubin Urine: NEGATIVE
Ketones, ur: NEGATIVE
Urobilinogen, UA: 0.2 (ref 0.0–1.0)

## 2013-01-31 LAB — LIPID PANEL
Cholesterol: 143 mg/dL (ref 0–200)
LDL Cholesterol: 59 mg/dL (ref 0–99)
Total CHOL/HDL Ratio: 2
VLDL: 18.2 mg/dL (ref 0.0–40.0)

## 2013-01-31 LAB — CBC WITH DIFFERENTIAL/PLATELET
Basophils Absolute: 0 10*3/uL (ref 0.0–0.1)
Basophils Relative: 0.3 % (ref 0.0–3.0)
Eosinophils Relative: 1.4 % (ref 0.0–5.0)
HCT: 36.4 % (ref 36.0–46.0)
Lymphocytes Relative: 18.6 % (ref 12.0–46.0)
Lymphs Abs: 1.6 10*3/uL (ref 0.7–4.0)
MCV: 90.4 fl (ref 78.0–100.0)
Monocytes Relative: 6.7 % (ref 3.0–12.0)
Neutrophils Relative %: 73 % (ref 43.0–77.0)
Platelets: 226 10*3/uL (ref 150.0–400.0)
RBC: 4.03 Mil/uL (ref 3.87–5.11)
RDW: 13.3 % (ref 11.5–14.6)
WBC: 8.3 10*3/uL (ref 4.5–10.5)

## 2013-01-31 LAB — HEPATIC FUNCTION PANEL
ALT: 12 U/L (ref 0–35)
AST: 16 U/L (ref 0–37)
Alkaline Phosphatase: 31 U/L — ABNORMAL LOW (ref 39–117)
Bilirubin, Direct: 0.1 mg/dL (ref 0.0–0.3)
Total Bilirubin: 0.5 mg/dL (ref 0.3–1.2)
Total Protein: 6.7 g/dL (ref 6.0–8.3)

## 2013-02-06 ENCOUNTER — Encounter: Payer: Self-pay | Admitting: Internal Medicine

## 2013-02-06 ENCOUNTER — Ambulatory Visit (INDEPENDENT_AMBULATORY_CARE_PROVIDER_SITE_OTHER): Payer: Federal, State, Local not specified - PPO | Admitting: Internal Medicine

## 2013-02-06 VITALS — BP 112/76 | HR 85 | Temp 98.7°F | Ht 66.0 in | Wt 127.5 lb

## 2013-02-06 DIAGNOSIS — I10 Essential (primary) hypertension: Secondary | ICD-10-CM

## 2013-02-06 DIAGNOSIS — Z0000 Encounter for general adult medical examination without abnormal findings: Secondary | ICD-10-CM

## 2013-02-06 DIAGNOSIS — Z23 Encounter for immunization: Secondary | ICD-10-CM

## 2013-02-06 MED ORDER — LISINOPRIL 20 MG PO TABS: 20.0000 mg | ORAL_TABLET | Freq: Every day | ORAL | Status: DC

## 2013-02-06 NOTE — Progress Notes (Signed)
Subjective:    Patient ID: Jane Mcdonald, female    DOB: 1956/03/16, 57 y.o.   MRN: [id number]  HPI  Here for wellness and f/u;  Overall doing ok;  Pt denies CP, worsening SOB, DOE, wheezing, orthopnea, PND, worsening LE edema, palpitations, dizziness or syncope.  Pt denies neurological change such as new headache, facial or extremity weakness.  Pt denies polydipsia, polyuria, or low sugar symptoms. Pt states overall good compliance with treatment and medications, good tolerability, and has been trying to follow lower cholesterol diet.  Pt denies worsening depressive symptoms, suicidal ideation or panic. No fever, night sweats, wt loss, loss of appetite, or other constitutional symptoms.  Pt states good ability with ADL's, has low fall risk, home safety reviewed and adequate, no other significant changes in hearing or vision, and only occasionally active with exercise.  No acute complaints Still needs colonscopy, has been putting this off, still thinking about it, will call if she wants this Past Medical History  Diagnosis Date  . HYPERTENSION 07/14/2010  . Renal artery stenosis, native, bilateral 07/28/2010  . FOOT PAIN, BILATERAL 03/30/2010  . SMOKER 07/14/2010  . ANXIETY, SITUATIONAL 07/14/2010  . Glaucoma     sees optho/Dr. Dione Booze every 6 months  . Rosacea   . Palpitations   . Glaucoma (increased eye pressure) 02/01/2012   Past Surgical History  Procedure Laterality Date  . Dilation and curettage of uterus      s/p  . Tubal ligation    . Oophorectomy      left only 2012, non malignant mass    reports that she has never smoked. She does not have any smokeless tobacco history on file. Her alcohol and drug histories are not on file. family history includes Cancer in her other; Heart disease in her father; Hypertension in her mother; Nephrolithiasis in her father. No Known Allergies Current Outpatient Prescriptions on File Prior to Visit  Medication Sig Dispense Refill  . aspirin 81 MG EC  tablet Take 1 tablet (81 mg total) by mouth daily. Swallow whole.  30 tablet  12  . latanoprost (XALATAN) 0.005 % ophthalmic solution 1 drop. Use as directed      . valACYclovir (VALTREX) 500 MG tablet Take 500 mg by mouth daily.         No current facility-administered medications on file prior to visit.   Review of Systems Constitutional: Negative for diaphoresis, activity change, appetite change or unexpected weight change.  HENT: Negative for hearing loss, ear pain, facial swelling, mouth sores and neck stiffness.   Eyes: Negative for pain, redness and visual disturbance.  Respiratory: Negative for shortness of breath and wheezing.   Cardiovascular: Negative for chest pain and palpitations.  Gastrointestinal: Negative for diarrhea, blood in stool, abdominal distention or other pain Genitourinary: Negative for hematuria, flank pain or change in urine volume.  Musculoskeletal: Negative for myalgias and joint swelling.  Skin: Negative for color change and wound.  Neurological: Negative for syncope and numbness. other than noted Hematological: Negative for adenopathy.  Psychiatric/Behavioral: Negative for hallucinations, self-injury, decreased concentration and agitation.      Objective:   Physical Exam BP 112/76  Pulse 85  Temp(Src) 98.7 F (37.1 C) (Oral)  Ht 5\' 6"  (1.676 m)  Wt 127 lb 8 oz (57.834 kg)  BMI 20.59 kg/m2  SpO2 98% VS noted,  Constitutional: Pt is oriented to person, place, and time. Appears well-developed and well-nourished.  Head: Normocephalic and atraumatic.  Right Ear: External ear normal.  Left  Ear: External ear normal.  Nose: Nose normal.  Mouth/Throat: Oropharynx is clear and moist.  Eyes: Conjunctivae and EOM are normal. Pupils are equal, round, and reactive to light.  Neck: Normal range of motion. Neck supple. No JVD present. No tracheal deviation present.  Cardiovascular: Normal rate, regular rhythm, normal heart sounds and intact distal pulses.    Pulmonary/Chest: Effort normal and breath sounds normal.  Abdominal: Soft. Bowel sounds are normal. There is no tenderness. No HSM  Musculoskeletal: Normal range of motion. Exhibits no edema.  Lymphadenopathy:  Has no cervical adenopathy.  Neurological: Pt is alert and oriented to person, place, and time. Pt has normal reflexes. No cranial nerve deficit.  Skin: Skin is warm and dry. No rash noted.  Psychiatric:  Has  normal mood and affect. Behavior is normal.     Assessment & Plan:

## 2013-02-06 NOTE — Assessment & Plan Note (Signed)
stable overall by history and exam, recent data reviewed with pt, and pt to continue medical treatment as before,  to f/u any worsening symptoms or concerns BP Readings from Last 3 Encounters:  02/06/13 112/76  11/25/12 121/75  09/06/12 110/70

## 2013-02-06 NOTE — Patient Instructions (Addendum)
You had the flu shot today Please call for your screening colonoscopy Please continue all other medications as before, and refills have been done if requested. Please have the pharmacy call with any other refills you may need. Please continue your efforts at being more active, low cholesterol diet, and weight control. You are otherwise up to date with prevention measures today. Please keep your appointments with your specialists as you may have planned  Please remember to sign up for My Chart if you have not done so, as this will be important to you in the future with finding out test results, communicating by private email, and scheduling acute appointments online when needed.  Please return in 1 year for your yearly visit, or sooner if needed, with Lab testing done 3-5 days before

## 2013-02-06 NOTE — Assessment & Plan Note (Signed)

## 2013-05-08 HISTORY — PX: COLONOSCOPY: SHX174

## 2013-05-20 ENCOUNTER — Ambulatory Visit (INDEPENDENT_AMBULATORY_CARE_PROVIDER_SITE_OTHER): Payer: Federal, State, Local not specified - PPO | Admitting: Internal Medicine

## 2013-05-20 ENCOUNTER — Encounter: Payer: Self-pay | Admitting: Internal Medicine

## 2013-05-20 VITALS — BP 132/74 | HR 88 | Temp 98.4°F | Ht 65.0 in | Wt 130.5 lb

## 2013-05-20 DIAGNOSIS — R221 Localized swelling, mass and lump, neck: Secondary | ICD-10-CM

## 2013-05-20 DIAGNOSIS — R220 Localized swelling, mass and lump, head: Secondary | ICD-10-CM

## 2013-05-20 DIAGNOSIS — F438 Other reactions to severe stress: Secondary | ICD-10-CM

## 2013-05-20 DIAGNOSIS — I10 Essential (primary) hypertension: Secondary | ICD-10-CM

## 2013-05-20 DIAGNOSIS — F4389 Other reactions to severe stress: Secondary | ICD-10-CM

## 2013-05-20 NOTE — Progress Notes (Signed)
Pre-visit discussion using our clinic review tool. No additional management support is needed unless otherwise documented below in the visit note.  

## 2013-05-20 NOTE — Assessment & Plan Note (Signed)
stable overall by history and exam, recent data reviewed with pt, and pt to continue medical treatment as before,  to f/u any worsening symptoms or concerns BP Readings from Last 3 Encounters:  05/20/13 132/74  02/06/13 112/76  11/25/12 121/75

## 2013-05-20 NOTE — Assessment & Plan Note (Signed)
Does have some asymmetry but I suspect salivary related, will ask for neck u/s

## 2013-05-20 NOTE — Assessment & Plan Note (Signed)
Improved,  to f/u any worsening symptoms or concerns 

## 2013-05-20 NOTE — Patient Instructions (Signed)
Please continue all other medications as before, and refills have been done if requested. Please have the pharmacy call with any other refills you may need.  You will be contacted regarding the referral for: neck ultrasound

## 2013-05-20 NOTE — Progress Notes (Signed)
Subjective:    Patient ID: Jane Mcdonald, female    DOB: 1955/12/10, 58 y.o.   MRN: [id number]  HPI  Here to f/u, saw dental for capped tooth last wk, no infection, no fever, but did note a lump to upper left neck below the jawline, did have URI in December, feels some sensation with swallowing (and even if not swallowing), and belching. Denies worsening reflux, abd pain, dysphagia, n/v, bowel change or blood. No voice change, no dysphagia, no dypsnea.  No wt change.  Denies worsening depressive symptoms, suicidal ideation, or panic Past Medical History  Diagnosis Date  . HYPERTENSION 07/14/2010  . Renal artery stenosis, native, bilateral 07/28/2010  . FOOT PAIN, BILATERAL 03/30/2010  . SMOKER 07/14/2010  . ANXIETY, SITUATIONAL 07/14/2010  . Glaucoma     sees optho/Dr. Dione BoozeGroat every 6 months  . Rosacea   . Palpitations   . Glaucoma (increased eye pressure) 02/01/2012   Past Surgical History  Procedure Laterality Date  . Dilation and curettage of uterus      s/p  . Tubal ligation    . Oophorectomy      left only 2012, non malignant mass    reports that she has never smoked. She does not have any smokeless tobacco history on file. Her alcohol and drug histories are not on file. family history includes Cancer in her other; Heart disease in her father; Hypertension in her mother; Nephrolithiasis in her father. No Known Allergies Current Outpatient Prescriptions on File Prior to Visit  Medication Sig Dispense Refill  . aspirin 81 MG EC tablet Take 1 tablet (81 mg total) by mouth daily. Swallow whole.  30 tablet  12  . latanoprost (XALATAN) 0.005 % ophthalmic solution 1 drop. Use as directed      . lisinopril (PRINIVIL,ZESTRIL) 20 MG tablet Take 1 tablet (20 mg total) by mouth daily.  90 tablet  3  . valACYclovir (VALTREX) 500 MG tablet Take 500 mg by mouth daily.         No current facility-administered medications on file prior to visit.   Review of Systems  Constitutional: Negative for  unexpected weight change, or unusual diaphoresis  HENT: Negative for tinnitus.   Eyes: Negative for photophobia and visual disturbance.  Respiratory: Negative for choking and stridor.   Gastrointestinal: Negative for vomiting and blood in stool.  Genitourinary: Negative for hematuria and decreased urine volume.  Musculoskeletal: Negative for acute joint swelling Skin: Negative for color change and wound.  Neurological: Negative for tremors and numbness other than noted  Psychiatric/Behavioral: Negative for decreased concentration or  hyperactivity.       Objective:   Physical Exam BP 132/74  Pulse 88  Temp(Src) 98.4 F (36.9 C) (Oral)  Ht 5\' 5"  (1.651 m)  Wt 130 lb 8 oz (59.194 kg)  BMI 21.72 kg/m2  SpO2 99% VS noted,  Constitutional: Pt appears well-developed and well-nourished.  HENT: Head: NCAT.  Right Ear: External ear normal.  Left Ear: External ear normal.  Eyes: Conjunctivae and EOM are normal. Pupils are equal, round, and reactive to light.  Neck: Normal range of motion. Neck supple ; has asymmetric submandibular fullness/mass left > right - ? Salivary gland vs LA. , has other minor scattered < 1/2 cm LN to angles or jaw, submandibular and pre and post SCM bilat Cardiovascular: Normal rate and regular rhythm.   Pulmonary/Chest: Effort normal and breath sounds normal.  Abd:  Soft, NT, non-distended, + BS - no HSM Neurological: Pt is  alert. Not confused  Skin: Skin is warm. No erythema. No LE edema Psychiatric: Pt behavior is normal. Thought content normal. mild nervous    Assessment & Plan:

## 2013-05-22 ENCOUNTER — Ambulatory Visit
Admission: RE | Admit: 2013-05-22 | Discharge: 2013-05-22 | Disposition: A | Discharge status: Home / Self Care | Payer: Federal, State, Local not specified - PPO | Source: Ambulatory Visit | Attending: Internal Medicine | Admitting: Internal Medicine

## 2013-05-22 ENCOUNTER — Encounter: Payer: Self-pay | Admitting: Internal Medicine

## 2013-05-22 DIAGNOSIS — R221 Localized swelling, mass and lump, neck: Secondary | ICD-10-CM

## 2013-05-23 ENCOUNTER — Telehealth: Payer: Self-pay | Admitting: Internal Medicine

## 2013-05-23 DIAGNOSIS — R221 Localized swelling, mass and lump, neck: Secondary | ICD-10-CM

## 2013-05-23 NOTE — Telephone Encounter (Signed)
Referral done

## 2013-05-23 NOTE — Telephone Encounter (Signed)
Message copied by Corwin LevinsJOHN, Shamecka Hocutt W on Fri May 23, 2013 12:20 PM ------      Message from: Scharlene GlossEWING, ROBIN B      Created: Fri May 23, 2013  9:15 AM       Called the patient informed of results and MD instructions.  The patient would like to go ahead and see an ENT as the lymph node is really bothering her.  Please do ENT Referral ------

## 2013-06-10 ENCOUNTER — Telehealth: Payer: Self-pay | Admitting: Internal Medicine

## 2013-06-10 NOTE — Telephone Encounter (Signed)
Relevant patient education mailed to patient.  

## 2013-08-01 LAB — HM MAMMOGRAPHY

## 2013-08-20 ENCOUNTER — Encounter: Payer: Self-pay | Admitting: Internal Medicine

## 2013-09-08 ENCOUNTER — Encounter: Payer: Self-pay | Admitting: Internal Medicine

## 2013-09-18 DIAGNOSIS — A609 Anogenital herpesviral infection, unspecified: Secondary | ICD-10-CM | POA: Insufficient documentation

## 2013-10-10 ENCOUNTER — Telehealth: Payer: Self-pay | Admitting: Cardiovascular Disease

## 2013-10-10 NOTE — Telephone Encounter (Signed)
Wants to have her renal duplex scan done at [facility].  Advised will forward message to Dr. Excell Seltzer and Maryann Alar to schedule.

## 2013-10-10 NOTE — Telephone Encounter (Signed)
New message     Pt want to go to [facility] to have her scan.  She gets one every year.  She is now closer to [facility].  Will Dr Excell Seltzer call and order the scan and make the appt?

## 2013-10-12 NOTE — Telephone Encounter (Signed)
This is fine with me. Please order Renal Arterial Duplex. Indication: Fibromuscular Dysplasia. thx

## 2013-10-14 NOTE — Telephone Encounter (Signed)
I spoke with the Vascular department at [facility] [phone]).  They need an order faxed to [phone].  Need name of test, office phone # and fax # to send results, Dx and code. Need copy of demographics and insurance information and in comments write for Vascular to please call the pt directly to schedule.

## 2013-10-23 NOTE — Telephone Encounter (Signed)
Order signed by Dr Excell Seltzerooper and [facility].

## 2013-10-24 ENCOUNTER — Telehealth: Payer: Self-pay | Admitting: Cardiovascular Disease

## 2013-10-24 NOTE — Telephone Encounter (Signed)
[facility] contacted the pt to schedule renal artery duplex but the pt said she wanted the test done and to be [facility]. Duplex has not been scheduled at this time.  I will have to speak with the pt to determine if she is planning to change providers.

## 2013-10-24 NOTE — Telephone Encounter (Signed)
New message    Office calling need clarification patient is stating one thing . Office has request set up renal doppler.    patient is telling them that she need a referral & doppler to be set up.    [facility]

## 2013-11-13 NOTE — Telephone Encounter (Signed)
Spoke with pt, she is transferring her care to [facility] because it is more convenient for her. Spoke with [facility], the pt has an appt to see them in auf=gust. They requested her records, will send a message to medical records.

## 2013-11-21 ENCOUNTER — Telehealth: Payer: Self-pay | Admitting: Cardiovascular Disease

## 2013-11-21 NOTE — Telephone Encounter (Signed)
New Message  Pt callled for understanding as to what she has to do to [facility]. Please call back to discuss.

## 2013-11-21 NOTE — Telephone Encounter (Signed)
Placed a ROI in the mail to the patient on 7.17.15 after discussing with LB a call received from [facility] Luz Lexaeihagh [facility]:djc

## 2013-11-21 NOTE — Telephone Encounter (Addendum)
I spoke with the pt after she spoke with Dena and made her aware that she does not need to complete ROI.  I have referred the pt to Cardiology at [facility] and they have access to our records through [facility].

## 2013-11-21 NOTE — Telephone Encounter (Signed)
I received a call from the Nephrology department (Dr. Luz Lexaeihagh) at [facility] requesting this pt's records for an upcoming appointment in August.  I made them aware that the pt is not scheduled with the correct department and that she needs to see Cardiology for FMD of Renal artery and HTN.  Nephrology cancelled the pt's appointment.   I called the pt and she is very frustrated that she cannot get an appointment arranged at [facility] and that it has taken "2 months" to get this addressed and that she now needs to sign a ROI to get her records. I made the pt aware that when she initially called in June she requested that she have a [facility] duplex [facility]. This order was faxed and they did contact the pt to schedule this test but the pt refused because she wanted all of her care transitioned and this was not clear in our discussion. The pt contacted [facility] and told them her diagnosis and they scheduled her with Nephrology.  I made the pt aware that this is not the appropriate appointment that she needs.  The pt became more agitated after I made her aware of this information.  I made her aware that she needs to see Cardiology and she requested to see Dr Titus MouldBelford.  I contacted [facility] and spoke with [name] [phone]). Appointment scheduled with Dr Titus MouldBelford on 12/25/13 at 10:30 at the [facility] in [facility]([address] 3rd floor). This is the location where Dr Titus MouldBelford sees patients. They will mail packet to the pt's home. I made [name] aware that the pt's records are in [facility].  I called the pt with this information and she is currently in traffic and very frustrated.  I made her aware that I would call her back when she was able to write information down but she refused. Information given to the pt and she will contact [name] with any other questions about appointment.

## 2014-01-20 ENCOUNTER — Encounter: Payer: Self-pay | Admitting: Sports Medicine

## 2014-01-20 ENCOUNTER — Ambulatory Visit (INDEPENDENT_AMBULATORY_CARE_PROVIDER_SITE_OTHER): Payer: Federal, State, Local not specified - PPO | Admitting: Sports Medicine

## 2014-01-20 VITALS — BP 123/75 | Ht 66.0 in | Wt 130.0 lb

## 2014-01-20 DIAGNOSIS — M722 Plantar fascial fibromatosis: Secondary | ICD-10-CM | POA: Diagnosis not present

## 2014-01-20 DIAGNOSIS — M25579 Pain in unspecified ankle and joints of unspecified foot: Secondary | ICD-10-CM

## 2014-01-20 DIAGNOSIS — M25571 Pain in right ankle and joints of right foot: Secondary | ICD-10-CM

## 2014-01-20 NOTE — Progress Notes (Signed)
   Subjective:    Patient ID: Jane Mcdonald, female    DOB: 1955-10-17, 58 y.o.   MRN: [id number]  HPI chief complaint: Right foot pain  Patient comes in today complaining of 3 weeks of right foot pain that she localizes to the arch of her foot. Pain began after she was doing quite a bit of barefoot walking at a water park. She was last seen in the office a little over a year ago. We made her some custom orthotics and add a metatarsal pad for metatarsalgia. These orthotics were quite comfortable up until 3 weeks ago when she began to experience her new onset arch pain. She switched from that particular orthotic to a softer orthotic which has been more comfortable for her. She does get pain at the heel as well. No pain at the metatarsal heads. No swelling. Pain is worse with first step particularly in the morning. She has a history of previous plantar fasciitis. She denies associated numbness or tingling. She has been doing some calf stretches and also getting some Achilles and plantar fascial stretches with an elastic band. She has also been icing.    Review of Systems     Objective:   Physical Exam Well-developed, well-nourished. No acute distress. She is tender to palpation at the calcaneal insertion of the plantar fascia. Tender to palpation along the mid arch as well. Negative Tinel's at the tarsal tunnel. Negative calcaneal squeeze. She has transverse arch collapse bilaterally. Neurovascularly intact distally. Walking with a slight limp.       Assessment & Plan:  Right foot pain secondary to plantar fasciitis  For now, she will remain in her older softer orthotics. I've given her an arch strap as well. I've instructed her in soleus stretches as well as a modified plantar fascial stretch. She will continue icing. She would also like to try some night splinting (I gave her the name of [facility]). Followup with me in 3 weeks. Once asymptomatic the patient may want to consider a  second pair of custom orthotics. If symptoms persist at followup we'll consider ultrasound evaluation.

## 2014-02-05 ENCOUNTER — Other Ambulatory Visit (INDEPENDENT_AMBULATORY_CARE_PROVIDER_SITE_OTHER): Payer: Federal, State, Local not specified - PPO

## 2014-02-05 DIAGNOSIS — Z0000 Encounter for general adult medical examination without abnormal findings: Secondary | ICD-10-CM

## 2014-02-05 LAB — LIPID PANEL
CHOL/HDL RATIO: 2
Cholesterol: 136 mg/dL (ref 0–200)
HDL: 55.2 mg/dL (ref >39.00)
LDL Cholesterol: 67 mg/dL (ref 0–99)
NONHDL: 80.8
Triglycerides: 71 mg/dL (ref 0.0–149.0)
VLDL: 14.2 mg/dL (ref 0.0–40.0)

## 2014-02-05 LAB — URINALYSIS, ROUTINE W REFLEX MICROSCOPIC
Bilirubin Urine: NEGATIVE
KETONES UR: NEGATIVE
Leukocytes, UA: NEGATIVE
Nitrite: NEGATIVE
PH: 7.5 (ref 5.0–8.0)
Specific Gravity, Urine: 1.01 (ref 1.000–1.030)
Total Protein, Urine: NEGATIVE
URINE GLUCOSE: NEGATIVE
Urobilinogen, UA: 0.2 (ref 0.0–1.0)

## 2014-02-05 LAB — CBC WITH DIFFERENTIAL/PLATELET
BASOS PCT: 0.3 % (ref 0.0–3.0)
Basophils Absolute: 0 10*3/uL (ref 0.0–0.1)
EOS ABS: 0.2 10*3/uL (ref 0.0–0.7)
Eosinophils Relative: 2.7 % (ref 0.0–5.0)
HCT: 37.2 % (ref 36.0–46.0)
Hemoglobin: 12.6 g/dL (ref 12.0–15.0)
LYMPHS PCT: 28.2 % (ref 12.0–46.0)
Lymphs Abs: 1.6 10*3/uL (ref 0.7–4.0)
MCHC: 33.9 g/dL (ref 30.0–36.0)
MCV: 90.6 fl (ref 78.0–100.0)
MONOS PCT: 7.7 % (ref 3.0–12.0)
Monocytes Absolute: 0.4 10*3/uL (ref 0.1–1.0)
NEUTROS PCT: 61.1 % (ref 43.0–77.0)
Neutro Abs: 3.5 10*3/uL (ref 1.4–7.7)
Platelets: 210 10*3/uL (ref 150.0–400.0)
RBC: 4.11 Mil/uL (ref 3.87–5.11)
RDW: 13.1 % (ref 11.5–15.5)
WBC: 5.7 10*3/uL (ref 4.0–10.5)

## 2014-02-05 LAB — BASIC METABOLIC PANEL
BUN: 11 mg/dL (ref 6–23)
CO2: 30 mEq/L (ref 19–32)
CREATININE: 0.7 mg/dL (ref 0.4–1.2)
Calcium: 9.1 mg/dL (ref 8.4–10.5)
Chloride: 104 mEq/L (ref 96–112)
GFR: 96.74 mL/min (ref >60.00)
Glucose, Bld: 85 mg/dL (ref 70–99)
Potassium: 4.5 mEq/L (ref 3.5–5.1)
Sodium: 140 mEq/L (ref 135–145)

## 2014-02-05 LAB — HEPATIC FUNCTION PANEL
ALK PHOS: 34 U/L — AB (ref 39–117)
ALT: 13 U/L (ref 0–35)
AST: 18 U/L (ref 0–37)
Albumin: 4.3 g/dL (ref 3.5–5.2)
BILIRUBIN DIRECT: 0.1 mg/dL (ref 0.0–0.3)
TOTAL PROTEIN: 7.1 g/dL (ref 6.0–8.3)
Total Bilirubin: 0.7 mg/dL (ref 0.2–1.2)

## 2014-02-05 LAB — TSH: TSH: 1.39 u[IU]/mL (ref 0.35–4.50)

## 2014-02-12 ENCOUNTER — Ambulatory Visit (INDEPENDENT_AMBULATORY_CARE_PROVIDER_SITE_OTHER): Payer: Federal, State, Local not specified - PPO | Admitting: Internal Medicine

## 2014-02-12 ENCOUNTER — Encounter: Payer: Self-pay | Admitting: Internal Medicine

## 2014-02-12 VITALS — BP 112/74 | HR 73 | Temp 98.3°F | Ht 66.0 in | Wt 129.0 lb

## 2014-02-12 DIAGNOSIS — Z23 Encounter for immunization: Secondary | ICD-10-CM

## 2014-02-12 DIAGNOSIS — Z0000 Encounter for general adult medical examination without abnormal findings: Secondary | ICD-10-CM

## 2014-02-12 DIAGNOSIS — I701 Atherosclerosis of renal artery: Secondary | ICD-10-CM

## 2014-02-12 DIAGNOSIS — I10 Essential (primary) hypertension: Secondary | ICD-10-CM

## 2014-02-12 MED ORDER — LISINOPRIL 20 MG PO TABS: 20.0000 mg | ORAL_TABLET | Freq: Every day | ORAL | Status: DC

## 2014-02-12 NOTE — Progress Notes (Signed)
Subjective:    Patient ID: Jane NightSusan Mcdonald, female    DOB: 09-05-1955, 58 y.o.   MRN: [id number]  HPI  Here for wellness and f/u;  Overall doing ok;  Pt denies CP, worsening SOB, DOE, wheezing, orthopnea, PND, worsening LE edema, palpitations, dizziness or syncope.  Pt denies neurological change such as new headache, facial or extremity weakness.  Pt denies polydipsia, polyuria, or low sugar symptoms. Pt states overall good compliance with treatment and medications, good tolerability, and has been trying to follow lower cholesterol diet.  Pt denies worsening depressive symptoms, suicidal ideation or panic. No fever, Mcdonald sweats, wt loss, loss of appetite, or other constitutional symptoms.  Pt states good ability with ADL's, has low fall risk, home safety reviewed and adequate, no other significant changes in hearing or vision, and only occasionally active with exercise. Did have f/u Renal artery duplex in August inconclusive it seems to me on review care everythere result, does have an appt with MD (? Vascular surgeon) in jan 2016 Past Medical History  Diagnosis Date  . HYPERTENSION 07/14/2010  . Renal artery stenosis, native, bilateral 07/28/2010  . FOOT PAIN, BILATERAL 03/30/2010  . SMOKER 07/14/2010  . ANXIETY, SITUATIONAL 07/14/2010  . Glaucoma     sees optho/Dr. Dione BoozeGroat every 6 months  . Rosacea   . Palpitations   . Glaucoma (increased eye pressure) 02/01/2012   Past Surgical History  Procedure Laterality Date  . Dilation and curettage of uterus      s/p  . Tubal ligation    . Oophorectomy      left only 2012, non malignant mass    reports that she has never smoked. She does not have any smokeless tobacco history on file. Her alcohol and drug histories are not on file. family history includes Cancer in her other; Heart disease in her father; Hypertension in her mother; Nephrolithiasis in her father. No Known Allergies Current Outpatient Prescriptions on File Prior to Visit  Medication Sig  Dispense Refill  . aspirin 81 MG EC tablet Take 1 tablet (81 mg total) by mouth daily. Swallow whole.  30 tablet  12  . aspirin EC 81 MG tablet Take 81 mg by mouth.      . latanoprost (XALATAN) 0.005 % ophthalmic solution 1 drop. Use as directed      . latanoprost (XALATAN) 0.005 % ophthalmic solution 1 drop.      Marland Kitchen. lisinopril (PRINIVIL,ZESTRIL) 20 MG tablet Take 20 mg by mouth.      . valACYclovir (VALTREX) 500 MG tablet Take 500 mg by mouth daily.        . valACYclovir (VALTREX) 500 MG tablet Take 500 mg by mouth.       No current facility-administered medications on file prior to visit.    Review of Systems Constitutional: Negative for increased diaphoresis, other activity, appetite or other siginficant weight change  HENT: Negative for worsening hearing loss, ear pain, facial swelling, mouth sores and neck stiffness.   Eyes: Negative for other worsening pain, redness or visual disturbance.  Respiratory: Negative for shortness of breath and wheezing.   Cardiovascular: Negative for chest pain and palpitations.  Gastrointestinal: Negative for diarrhea, blood in stool, abdominal distention or other pain Genitourinary: Negative for hematuria, flank pain or change in urine volume.  Musculoskeletal: Negative for myalgias or other joint complaints.  Skin: Negative for color change and wound.  Neurological: Negative for syncope and numbness. other than noted Hematological: Negative for adenopathy. or other swelling Psychiatric/Behavioral: Negative  for hallucinations, self-injury, decreased concentration or other worsening agitation.      Objective:   Physical Exam BP 112/74  Pulse 73  Temp(Src) 98.3 F (36.8 C) (Oral)  Ht 5\' 6"  (1.676 m)  Wt 129 lb (58.514 kg)  BMI 20.83 kg/m2  SpO2 96% VS noted,  Constitutional: Pt is oriented to person, place, and time. Appears well-developed and well-nourished.  Head: Normocephalic and atraumatic.  Right Ear: External ear normal.  Left Ear:  External ear normal.  Nose: Nose normal.  Mouth/Throat: Oropharynx is clear and moist.  Eyes: Conjunctivae and EOM are normal. Pupils are equal, round, and reactive to light.  Neck: Normal range of motion. Neck supple. No JVD present. No tracheal deviation present.  Cardiovascular: Normal rate, regular rhythm, normal heart sounds and intact distal pulses.   Pulmonary/Chest: Effort normal and breath sounds without rales or wheezing  Abdominal: Soft. Bowel sounds are normal. NT. No HSM  Musculoskeletal: Normal range of motion. Exhibits no edema.  Lymphadenopathy:  Has no cervical adenopathy.  Neurological: Pt is alert and oriented to person, place, and time. Pt has normal reflexes. No cranial nerve deficit. Motor grossly intact Skin: Skin is warm and dry. No rash noted.  Psychiatric:  Has normal mood and affect. Behavior is normal.     Assessment & Plan:

## 2014-02-12 NOTE — Progress Notes (Signed)
Pre visit review using our clinic review tool, if applicable. No additional management support is needed unless otherwise documented below in the visit note. 

## 2014-02-12 NOTE — Patient Instructions (Addendum)
You had the flu shot today  Please continue all other medications as before, and refills have been done if requested.  Please have the pharmacy call with any other refills you may need.  Please continue your efforts at being more active, low cholesterol diet, and weight control.  You are otherwise up to date with prevention measures today.  Please keep your appointments with your specialists as you may have planned  You will be contacted regarding the referral for: CT Angiography for Abdomen/pelvis, for the renal artery  Depending on the results, you could elect to see Dr Excell Seltzerooper again, or [facility] you currently have [facility] for Jan 2016  Your Lab work was OK today  Please return in 1 year for your yearly visit, or sooner if needed, with Lab testing done 3-5 days before

## 2014-02-15 DIAGNOSIS — I10 Essential (primary) hypertension: Secondary | ICD-10-CM | POA: Insufficient documentation

## 2014-02-15 NOTE — Assessment & Plan Note (Signed)
stable overall by history and exam, recent data reviewed with pt, and pt to continue medical treatment as before,  to f/u any worsening symptoms or concerns BP Readings from Last 3 Encounters:  02/12/14 112/74  01/20/14 123/75  05/20/13 132/74

## 2014-02-15 NOTE — Assessment & Plan Note (Signed)

## 2014-02-15 NOTE — Assessment & Plan Note (Signed)
For f/u CTA abd /pelvis - r/o worsening

## 2014-02-16 ENCOUNTER — Ambulatory Visit (INDEPENDENT_AMBULATORY_CARE_PROVIDER_SITE_OTHER): Payer: Federal, State, Local not specified - PPO | Admitting: Sports Medicine

## 2014-02-16 ENCOUNTER — Encounter: Payer: Self-pay | Admitting: Sports Medicine

## 2014-02-16 VITALS — BP 100/65 | HR 75 | Ht 66.0 in | Wt 129.0 lb

## 2014-02-16 DIAGNOSIS — M722 Plantar fascial fibromatosis: Secondary | ICD-10-CM | POA: Diagnosis not present

## 2014-02-16 NOTE — Progress Notes (Signed)
  Jane NightSusan Mcdonald - 58 y.o. female MRN [id number]  Date of birth: 1956/02/01   SUBJECTIVE:     Ms. Jane Mcdonald is a 58 yo female presenting for follow up for plantar fasciitis. She was fitted with a mid foot strap and given home modalities. She didn't tolerate the mid foot strap. She bough a Mcdonald splint and that greatly improved her morning symptoms. Her pain is improved but it is still present. It is localized and consistent. She avoids walking barefoot.  She takes advil as needed for pain. She has worn orthotics previously with improvement of her symptoms.   ROS:     See HPI   OBJECTIVE: BP 100/65  Pulse 75  Ht 5\' 6"  (1.676 m)  Wt 129 lb (58.514 kg)  BMI 20.83 kg/m2  Physical Exam:  Vital signs are reviewed. General: well appearing female, alert, NAD  Foot Exam:  Laterality: right Appearance: bunion f/l, valgus deformity of first great toe b/l  Gait: slight pronation  Edema: no   Tenderness: yes, mid arch posterior to calcaneous  Range of Motion: Passive Extension: normal Flexion:normal Active Extension: normal Flexion: normal Laxity: none Neurovascularly intact: yes  Toe shape:   Hammer: none   Subluxation: none  Arch: pes cavus  Leg length discrepancy: none   Maneuvers: Anterior drawer: normal  Strength:  Dorsiflexion: 5/5 Plantarflexion: 5/5 Inversion: 5/5 Eversion: 5/5   ASSESSMENT & PLAN:  See problem based charting.

## 2014-02-16 NOTE — Assessment & Plan Note (Addendum)
Pain improving with night splint and exercises.  - continue stretches and exercises  - continue ice  - custom orthotics - f/u PRN   Patient was fitted for a : standard, cushioned, semi-rigid orthotic. The orthotic was heated and afterward the patient stood on the orthotic blank positioned on the orthotic stand. The patient was positioned in subtalar neutral position and 10 degrees of ankle dorsiflexion in a weight bearing stance. After completion of molding, a stable base was applied to the orthotic blank. The blank was ground to a stable position for weight bearing. Size: 8  Base: blue EVA Posting: none  Additional orthotic padding: right orthotic small metatarsal pad   Total of 30 minutes spent in face to face consultation with >50% of the time spent in orthotic construction, fitting, and instruction.

## 2014-02-18 ENCOUNTER — Telehealth: Payer: Self-pay | Admitting: *Deleted

## 2014-02-18 NOTE — Telephone Encounter (Signed)
Left msg on triage checking status on her CT saw md 02/12/14. Called pt back inform her md did place the referral will [facility] the [facility] [place]any) give her call bck with status...Raechel Chute/lmb

## 2014-02-24 ENCOUNTER — Ambulatory Visit (INDEPENDENT_AMBULATORY_CARE_PROVIDER_SITE_OTHER)
Admission: RE | Admit: 2014-02-24 | Discharge: 2014-02-24 | Disposition: A | Discharge status: Home / Self Care | Payer: Federal, State, Local not specified - PPO | Source: Ambulatory Visit | Attending: Internal Medicine | Admitting: Internal Medicine

## 2014-02-24 ENCOUNTER — Encounter: Payer: Self-pay | Admitting: Internal Medicine

## 2014-02-24 DIAGNOSIS — I10 Essential (primary) hypertension: Secondary | ICD-10-CM

## 2014-02-24 DIAGNOSIS — I701 Atherosclerosis of renal artery: Secondary | ICD-10-CM

## 2014-02-24 MED ORDER — IOHEXOL 350 MG/ML SOLN
100.0000 mL | Freq: Once | INTRAVENOUS | Status: AC | PRN
  Administered 2014-02-24: 100 mL via INTRAVENOUS

## 2014-11-24 ENCOUNTER — Telehealth: Payer: Self-pay | Admitting: Internal Medicine

## 2014-11-24 ENCOUNTER — Other Ambulatory Visit: Payer: Self-pay | Admitting: Internal Medicine

## 2014-11-24 DIAGNOSIS — K4090 Unilateral inguinal hernia, without obstruction or gangrene, not specified as recurrent: Secondary | ICD-10-CM

## 2014-11-24 NOTE — Telephone Encounter (Signed)
Patient stated she need a hernia recommendation, please advise

## 2014-11-26 NOTE — Telephone Encounter (Signed)
Patient is calling back regarding a hernia recommendation

## 2014-11-27 NOTE — Addendum Note (Signed)
Addended by: Corwin Levins on: 11/27/2014 11:57 AM   Modules accepted: Orders

## 2014-11-27 NOTE — Telephone Encounter (Signed)
Referral done

## 2014-11-27 NOTE — Telephone Encounter (Signed)
Pt is requesting a referral regarding an inguinal hernia Dx at recent GYN visit

## 2014-11-30 ENCOUNTER — Telehealth: Payer: Self-pay | Admitting: Internal Medicine

## 2014-11-30 NOTE — Telephone Encounter (Signed)
Patient Just want to say thank you for expeditious care.

## 2014-12-01 NOTE — Telephone Encounter (Signed)
A nice compliment!  FYI

## 2014-12-01 NOTE — Telephone Encounter (Signed)
Thank you :)

## 2015-01-04 ENCOUNTER — Encounter: Payer: Self-pay | Admitting: General Surgery

## 2015-01-04 NOTE — Progress Notes (Unsigned)
Sheetal Lyall 01/04/2015 3:53 PM Location: [facility] Patient #: [id number] DOB: 1956/02/01 Single / Language: [place] / Race: White Female  History of Present Illness Adolph Pollack MD; 01/04/2015 4:33 PM) Patient words: hernia.  The patient is a 59 year old female   Note:She is referred by Dr. Jonny Ruiz because of right groin pain and concern for hernia. Back in June, she had the gradual onset of right groin pain that intermittently radiates around to her back. She also has some intermittent bloating. She does a lot of heavy lifting at work ([employer]) and outdoor work at times. She saw her gynecologist because of felt like pain she's had in her left ovary in the past. Ultrasound was done and she was told that it did not show anything substantial. There is a strong family history of inguinal hernia. She denies having to strain to urinate or have a bowel movement.   Other Problems Gilmer Mor, CMA; 01/04/2015 3:53 PM) High blood pressure Oophorectomy  Diagnostic Studies History Gilmer Mor, CMA; 01/04/2015 3:53 PM) Colonoscopy 1-5 years ago Mammogram within last year Pap Smear 1-5 years ago  Allergies Gilmer Mor, CMA; 01/04/2015 3:56 PM) No Known Drug Allergies08/29/2016  Medication History (Sonya Bynum, CMA; 01/04/2015 3:57 PM) Latanoprost (0.005% Solution, Ophthalmic) Active. Lisinopril (  Tablet, Oral) Active. ValACYclovir HCl (  Tablet, Oral) Active. Aspirin (  Tablet DR, Oral) Active. Medications Reconciled  Social History Gilmer Mor, CMA; 01/04/2015 3:53 PM) Alcohol use Occasional alcohol use. Caffeine use Coffee. No drug use Tobacco use Former smoker.  Family History Gilmer Mor, CMA; 01/04/2015 3:53 PM) Heart Disease Father.  Pregnancy / Birth History Gilmer Mor, CMA; 01/04/2015 3:53 PM) Age at menarche 15 years. Contraceptive History Oral contraceptives. Gravida 0  Review of Systems Lamar Laundry Bynum CMA;  01/04/2015 3:53 PM) General Not Present- Appetite Loss, Chills, Fatigue, Fever, Night Sweats, Weight Gain and Weight Loss. Skin Not Present- Change in Wart/Mole, Dryness, Hives, Jaundice, New Lesions, Non-Healing Wounds, Rash and Ulcer. HEENT Present- Wears glasses/contact lenses. Not Present- Earache, Hearing Loss, Hoarseness, Nose Bleed, Oral Ulcers, Ringing in the Ears, Seasonal Allergies, Sinus Pain, Sore Throat, Visual Disturbances and Yellow Eyes. Respiratory Not Present- Bloody sputum, Chronic Cough, Difficulty Breathing, Snoring and Wheezing. Breast Not Present- Breast Mass, Breast Pain, Nipple Discharge and Skin Changes. Cardiovascular Not Present- Chest Pain, Difficulty Breathing Lying Down, Leg Cramps, Palpitations, Rapid Heart Rate, Shortness of Breath and Swelling of Extremities. Gastrointestinal Present- Abdominal Pain and Bloating. Not Present- Bloody Stool, Change in Bowel Habits, Chronic diarrhea, Constipation, Difficulty Swallowing, Excessive gas, Gets full quickly at meals, Hemorrhoids, Indigestion, Nausea, Rectal Pain and Vomiting. Female Genitourinary Not Present- Frequency, Nocturia, Painful Urination, Pelvic Pain and Urgency. Musculoskeletal Not Present- Back Pain, Joint Pain, Joint Stiffness, Muscle Pain, Muscle Weakness and Swelling of Extremities. Neurological Not Present- Decreased Memory, Fainting, Headaches, Numbness, Seizures, Tingling, Tremor, Trouble walking and Weakness. Psychiatric Not Present- Anxiety, Bipolar, Change in Sleep Pattern, Depression, Fearful and Frequent crying. Endocrine Not Present- Cold Intolerance, Excessive Hunger, Hair Changes, Heat Intolerance, Hot flashes and New Diabetes. Hematology Not Present- Easy Bruising, Excessive bleeding, Gland problems, HIV and Persistent Infections.   Vitals (Sonya Bynum CMA; 01/04/2015 3:56 PM) 01/04/2015 3:55 PM Weight: 133.6 lb Height: 65in Body Surface Area: 1.67 m Body Mass Index: 22.23 kg/m Temp.:  98.65F(Temporal)  Pulse: 79 (Regular)  BP: 128/80 (Sitting, Left Arm, Standard)    Physical Exam Adolph Pollack MD; 01/04/2015 4:34 PM) The physical exam findings are as follows: Note:General-well-developed, well-nourished, no acute  distress.  Abdomen-lower transverse scar is present. No umbilical hernia.  GU-no inguinal bulges are palpated in the upright or supine positions with Valsalva maneuvers.    Assessment & Plan Adolph Pollack MD; 01/04/2015 4:36 PM) Limmie Patricia, RIGHT, INITIAL ENCOUNTER [phone]) Impression: No clinical evidence of a right inguinal hernia.  Plan: Anti-inflammatory daily for 4 weeks. Light activities for 4 weeks and resume usual activities. If the pain returns or if the pain does not go away with this, I asked her to call us back and we would arrange for a physical therapy consultation for stretching and strengthening of the groin musculature.  Avel Peace, MD

## 2015-02-10 ENCOUNTER — Other Ambulatory Visit (INDEPENDENT_AMBULATORY_CARE_PROVIDER_SITE_OTHER): Payer: Federal, State, Local not specified - PPO

## 2015-02-10 DIAGNOSIS — Z0000 Encounter for general adult medical examination without abnormal findings: Secondary | ICD-10-CM

## 2015-02-10 LAB — BASIC METABOLIC PANEL
BUN: 11 mg/dL (ref 6–23)
CALCIUM: 9.4 mg/dL (ref 8.4–10.5)
CO2: 30 mEq/L (ref 19–32)
CREATININE: 0.7 mg/dL (ref 0.40–1.20)
Chloride: 102 mEq/L (ref 96–112)
GFR: 91.63 mL/min (ref >60.00)
GLUCOSE: 93 mg/dL (ref 70–99)
Potassium: 4 mEq/L (ref 3.5–5.1)
Sodium: 139 mEq/L (ref 135–145)

## 2015-02-10 LAB — CBC WITH DIFFERENTIAL/PLATELET
Basophils Absolute: 0 10*3/uL (ref 0.0–0.1)
Basophils Relative: 0.4 % (ref 0.0–3.0)
EOS PCT: 1.5 % (ref 0.0–5.0)
Eosinophils Absolute: 0.1 10*3/uL (ref 0.0–0.7)
HCT: 38.3 % (ref 36.0–46.0)
HEMOGLOBIN: 13 g/dL (ref 12.0–15.0)
LYMPHS PCT: 21.4 % (ref 12.0–46.0)
Lymphs Abs: 1.3 10*3/uL (ref 0.7–4.0)
MCHC: 34.1 g/dL (ref 30.0–36.0)
MCV: 89.5 fl (ref 78.0–100.0)
MONO ABS: 0.4 10*3/uL (ref 0.1–1.0)
Monocytes Relative: 6.7 % (ref 3.0–12.0)
Neutro Abs: 4.4 10*3/uL (ref 1.4–7.7)
Neutrophils Relative %: 70 % (ref 43.0–77.0)
Platelets: 250 10*3/uL (ref 150.0–400.0)
RBC: 4.27 Mil/uL (ref 3.87–5.11)
RDW: 12.9 % (ref 11.5–15.5)
WBC: 6.3 10*3/uL (ref 4.0–10.5)

## 2015-02-10 LAB — HEPATIC FUNCTION PANEL
ALBUMIN: 4.3 g/dL (ref 3.5–5.2)
ALT: 12 U/L (ref 0–35)
AST: 14 U/L (ref 0–37)
Alkaline Phosphatase: 38 U/L — ABNORMAL LOW (ref 39–117)
Bilirubin, Direct: 0.1 mg/dL (ref 0.0–0.3)
Total Bilirubin: 0.7 mg/dL (ref 0.2–1.2)
Total Protein: 7 g/dL (ref 6.0–8.3)

## 2015-02-10 LAB — URINALYSIS, ROUTINE W REFLEX MICROSCOPIC
BILIRUBIN URINE: NEGATIVE
Ketones, ur: NEGATIVE
LEUKOCYTES UA: NEGATIVE
NITRITE: NEGATIVE
Specific Gravity, Urine: 1.015 (ref 1.000–1.030)
TOTAL PROTEIN, URINE-UPE24: NEGATIVE
URINE GLUCOSE: NEGATIVE
Urobilinogen, UA: 0.2 (ref 0.0–1.0)
pH: 7.5 (ref 5.0–8.0)

## 2015-02-10 LAB — LIPID PANEL
CHOLESTEROL: 137 mg/dL (ref 0–200)
HDL: 61.4 mg/dL (ref >39.00)
LDL CALC: 54 mg/dL (ref 0–99)
NonHDL: 75.15
TRIGLYCERIDES: 108 mg/dL (ref 0.0–149.0)
Total CHOL/HDL Ratio: 2
VLDL: 21.6 mg/dL (ref 0.0–40.0)

## 2015-02-10 LAB — TSH: TSH: 1.12 u[IU]/mL (ref 0.35–4.50)

## 2015-02-18 ENCOUNTER — Encounter: Payer: Self-pay | Admitting: Internal Medicine

## 2015-02-18 ENCOUNTER — Ambulatory Visit (INDEPENDENT_AMBULATORY_CARE_PROVIDER_SITE_OTHER): Payer: Federal, State, Local not specified - PPO | Admitting: Internal Medicine

## 2015-02-18 ENCOUNTER — Other Ambulatory Visit: Payer: Federal, State, Local not specified - PPO

## 2015-02-18 VITALS — BP 132/76 | HR 81 | Temp 98.7°F | Ht 66.0 in | Wt 134.0 lb

## 2015-02-18 DIAGNOSIS — Z0000 Encounter for general adult medical examination without abnormal findings: Secondary | ICD-10-CM

## 2015-02-18 DIAGNOSIS — R1030 Lower abdominal pain, unspecified: Secondary | ICD-10-CM | POA: Diagnosis not present

## 2015-02-18 DIAGNOSIS — R1031 Right lower quadrant pain: Secondary | ICD-10-CM | POA: Insufficient documentation

## 2015-02-18 DIAGNOSIS — Z23 Encounter for immunization: Secondary | ICD-10-CM

## 2015-02-18 MED ORDER — LISINOPRIL 20 MG PO TABS: 20.0000 mg | ORAL_TABLET | Freq: Every day | ORAL | Status: DC

## 2015-02-18 NOTE — Addendum Note (Signed)
Addended by: Anselm JunglingBONYUN-DEBOURGH, DAHLIA M on: 02/18/2015 02:14 PM   Modules accepted: Orders

## 2015-02-18 NOTE — Progress Notes (Signed)
Subjective:    Patient ID: Jane Mcdonald, female    DOB: [birth year], 59 y.o.   MRN: [id number]  HPI  Here for wellness and f/u;  Overall doing ok;  Pt denies Chest pain, worsening SOB, DOE, wheezing, orthopnea, PND, worsening LE edema, palpitations, dizziness or syncope.  Pt denies neurological change such as new headache, facial or extremity weakness.  Pt denies polydipsia, polyuria, or low sugar symptoms. Pt states overall good compliance with treatment and medications, good tolerability, and has been trying to follow appropriate diet.  Pt denies worsening depressive symptoms, suicidal ideation or panic. No fever, night sweats, wt loss, loss of appetite, or other constitutional symptoms.  Pt states good ability with ADL's, has low fall risk, home safety reviewed and adequate, no other significant changes in hearing or vision, and occasionally active with exercise.  Had recent right groin pain/inguinal pain, u/s neg for hernia, likely msk strain per gen surg, but unfort pain persists, twinge like but pressure like "bent balloon" with radiati to right lower abd as well, ongoing since early June  Wt Readings from Last 3 Encounters:  02/18/15 134 lb (60.782 kg)  02/16/14 129 lb (58.514 kg)  02/12/14 129 lb (58.514 kg)   Past Medical History  Diagnosis Date  . HYPERTENSION 07/14/2010  . Renal artery stenosis, native, bilateral ([facility]) 07/28/2010  . FOOT PAIN, BILATERAL 03/30/2010  . SMOKER 07/14/2010  . ANXIETY, SITUATIONAL 07/14/2010  . Glaucoma     sees optho/Dr. Dione Booze every 6 months  . Rosacea   . Palpitations   . Glaucoma (increased eye pressure) 02/01/2012   Past Surgical History  Procedure Laterality Date  . Dilation and curettage of uterus      s/p  . Tubal ligation    . Oophorectomy      left only 2012, non malignant mass    reports that she has never smoked. She does not have any smokeless tobacco history on file. Her alcohol and drug histories are not on file. family history includes  Cancer in her other; Heart disease in her father; Hypertension in her mother; Nephrolithiasis in her father. No Known Allergies Current Outpatient Prescriptions on File Prior to Visit  Medication Sig Dispense Refill  . aspirin 81 MG EC tablet Take 1 tablet (81 mg total) by mouth daily. Swallow whole. 30 tablet 12  . latanoprost (XALATAN) 0.005 % ophthalmic solution 1 drop. Use as directed    . valACYclovir (VALTREX) 500 MG tablet Take 500 mg by mouth daily.       No current facility-administered medications on file prior to visit.   Review of Systems Constitutional: Negative for increased diaphoresis, other activity, appetite or siginficant weight change other than noted HENT: Negative for worsening hearing loss, ear pain, facial swelling, mouth sores and neck stiffness.   Eyes: Negative for other worsening pain, redness or visual disturbance.  Respiratory: Negative for shortness of breath and wheezing  Cardiovascular: Negative for chest pain and palpitations.  Gastrointestinal: Negative for diarrhea, blood in stool, abdominal distention or other pain Genitourinary: Negative for hematuria, flank pain or change in urine volume.  Musculoskeletal: Negative for myalgias or other joint complaints.  Skin: Negative for color change and wound or drainage.  Neurological: Negative for syncope and numbness. other than noted Hematological: Negative for adenopathy. or other swelling Psychiatric/Behavioral: Negative for hallucinations, SI, self-injury, decreased concentration or other worsening agitation.      Objective:   Physical Exam BP 132/76 mmHg  Pulse 81  Temp(Src) 98.7 F (  37.1 C) (Oral)  Ht 5\' 6"  (1.676 m)  Wt 134 lb (60.782 kg)  BMI 21.64 kg/m2  SpO2 96% VS noted,  Constitutional: Pt is oriented to person, place, and time. Appears well-developed and well-nourished, in no significant distress Head: Normocephalic and atraumatic.  Right Ear: External ear normal.  Left Ear: External  ear normal.  Nose: Nose normal.  Mouth/Throat: Oropharynx is clear and moist.  Eyes: Conjunctivae and EOM are normal. Pupils are equal, round, and reactive to light.  Neck: Normal range of motion. Neck supple. No JVD present. No tracheal deviation present or significant neck LA or mass Cardiovascular: Normal rate, regular rhythm, normal heart sounds and intact distal pulses.   Pulmonary/Chest: Effort normal and breath sounds without rales or wheezing  Abdominal: Soft. Bowel sounds are normal. NT. No HSM  Musculoskeletal: Normal range of motion. Exhibits no edema.  Lymphadenopathy:  Has no cervical adenopathy.  Neurological: Pt is alert and oriented to person, place, and time. Pt has normal reflexes. No cranial nerve deficit. Motor grossly intact Skin: Skin is warm and dry. No rash noted.  Psychiatric:  Has normal mood and affect. Behavior is normal.      Assessment & Plan:

## 2015-02-18 NOTE — Assessment & Plan Note (Signed)
?   MSK vs hip , possibly work related, to see DR Smith/sport med

## 2015-02-18 NOTE — Assessment & Plan Note (Signed)

## 2015-02-18 NOTE — Addendum Note (Signed)
Addended by: Corwin LevinsJOHN, JAMES W on: 02/18/2015 02:06 PM   Modules accepted: Kipp BroodSmartSet

## 2015-02-18 NOTE — Progress Notes (Signed)
Pre visit review using our clinic review tool, if applicable. No additional management support is needed unless otherwise documented below in the visit note. 

## 2015-02-18 NOTE — Patient Instructions (Addendum)
You had the flu shot today  Please continue all other medications as before, and refills have been done if requested.  Please have the pharmacy call with any other refills you may need.  Please continue your efforts at being more active, low cholesterol diet, and weight control.  You are otherwise up to date with prevention measures today.  Please keep your appointments with your specialists as you may have planned  Please return in 1 year for your yearly visit, or sooner if needed, with Lab testing done 3-5 days before  Please make appt with Dr Smith/sport medicine for the right groin pain

## 2015-02-19 LAB — HEPATITIS C ANTIBODY: HCV AB: NEGATIVE

## 2015-03-01 ENCOUNTER — Ambulatory Visit (INDEPENDENT_AMBULATORY_CARE_PROVIDER_SITE_OTHER)
Admission: RE | Admit: 2015-03-01 | Discharge: 2015-03-01 | Disposition: A | Discharge status: Home / Self Care | Payer: Federal, State, Local not specified - PPO | Source: Ambulatory Visit | Attending: Family Medicine | Admitting: Family Medicine

## 2015-03-01 ENCOUNTER — Ambulatory Visit (INDEPENDENT_AMBULATORY_CARE_PROVIDER_SITE_OTHER): Payer: Federal, State, Local not specified - PPO | Admitting: Family Medicine

## 2015-03-01 ENCOUNTER — Encounter: Payer: Self-pay | Admitting: Family Medicine

## 2015-03-01 ENCOUNTER — Encounter: Payer: Self-pay | Admitting: *Deleted

## 2015-03-01 VITALS — BP 138/62 | HR 94 | Ht 66.0 in | Wt 133.0 lb

## 2015-03-01 DIAGNOSIS — M25551 Pain in right hip: Secondary | ICD-10-CM

## 2015-03-01 DIAGNOSIS — S76011A Strain of muscle, fascia and tendon of right hip, initial encounter: Secondary | ICD-10-CM

## 2015-03-01 MED ORDER — NAPROXEN 500 MG PO TABS: 500.0000 mg | ORAL_TABLET | Freq: Two times a day (BID) | ORAL | Status: DC

## 2015-03-01 NOTE — Progress Notes (Signed)
Tawana ScaleZach Shalene Gallen D.O. [facility] [address] [place], [place] [address] Phone: [phone] Subjective:    I'm seeing this patient by the request  of:  Oliver BarreJames John, MD   CC: right groin pain.  BJY:[id number]:Subjective Jane NightSusan Mervin is a 59 y.o. female coming in with complaint of right groin pain. Patient has had this pain for many weeks if not months. Patient saw another provider for an was diagnosed with more of a groin strain. Patient was out of work for the last month only doing light duty. Patient states that this seemed to help it somewhat but caused other discomfort. Patient feels like it is getting somewhat better but it does seem that when flexing seems to be worse. States that it stays more localized in the right groin area. Sometimes can radiate which her back but never down her leg. Denies any weakness. Denies any nighttime pain. Able to walk without any difficulty she states. Patient is just concerned as it did not seem to be getting better. Takes over-the-counter anti-inflammatories which is beneficial. No numbness associated with it and denies any fever, chills, or any abnormal weight loss.  Past Medical History  Diagnosis Date  . HYPERTENSION 07/14/2010  . Renal artery stenosis, native, bilateral (HCC) 07/28/2010  . FOOT PAIN, BILATERAL 03/30/2010  . SMOKER 07/14/2010  . ANXIETY, SITUATIONAL 07/14/2010  . Glaucoma     sees optho/Dr. Dione BoozeGroat every 6 months  . Rosacea   . Palpitations   . Glaucoma (increased eye pressure) 02/01/2012   Past Surgical History  Procedure Laterality Date  . Dilation and curettage of uterus      s/p  . Tubal ligation    . Oophorectomy      left only 2012, non malignant mass   Social History  Substance Use Topics  . Smoking status: Never Smoker   . Smokeless tobacco: None  . Alcohol Use: None   No Known Allergies      Past medical history, social, surgical and family history all reviewed in electronic medical record.   Review of Systems: No  headache, visual changes, nausea, vomiting, diarrhea, constipation, dizziness, abdominal pain, skin rash, fevers, chills, Mcdonald sweats, weight loss, swollen lymph nodes, body aches, joint swelling, muscle aches, chest pain, shortness of breath, mood changes.   Objective Blood pressure 138/62, pulse 94, height 5\' 6"  (1.676 m), weight 133 lb (60.328 kg), SpO2 97 %.  General: No apparent distress alert and oriented x3 mood and affect normal, dressed appropriately.  HEENT: Pupils equal, extraocular movements intact  Respiratory: Patient's speak in full sentences and does not appear short of breath  Cardiovascular: No lower extremity edema, non tender, no erythema  Skin: Warm dry intact with no signs of infection or rash on extremities or on axial skeleton.  Abdomen: Soft nontender  Neuro: Cranial nerves II through XII are intact, neurovascularly intact in all extremities with 2+ DTRs and 2+ pulses.  Lymph: No lymphadenopathy of posterior or anterior cervical chain or axillae bilaterally.  Gait normal with good balance and coordination.  MSK:  Non tender with full range of motion and good stability and symmetric strength and tone of shoulders, elbows, wrist,  knee and ankles bilaterally.  Hip: Right ROM IR: 25 Deg, ER: 45 Deg, Flexion: 120 Deg, Extension: 100 Deg, Abduction: 45 Deg, Adduction: 45 Deg Strength IR: 5/5, ER: 5/5, Flexion: 5/5, Extension: 4/5, Abduction: 5/5, Adduction: 5/5 Pelvic alignment unremarkable to inspection and palpation. Standing hip rotation and gait without trendelenburg sign /  unsteadiness. Greater trochanter without tenderness to palpation. No tenderness over piriformis and greater trochanter. No pain with FABER or FADIR. No SI joint tenderness and normal minimal SI movement. Some tightness of the right hip flexor Contralateral hip unremarkable   procedure note 97110; 15 minutes spent for Therapeutic exercises as stated in above notes.  This included exercises  focusing on stretching, strengthening, with significant focus on eccentric aspects.   Proper technique shown and discussed handout in great detail with ATC.  All qHip strengthening exercises which included:  Pelvic tilt/bracing to help with proper recruitment of the lower abs and pelvic floor muscles  Glute strengthening to properly contract glutes without over-engaging low back and hamstrings - prone hip extension and glute bridge exercises Proper stretching techniques to increase effectiveness for the hip flexors, groin, quads, piriformis and low back when appropriate uestions were discussed and answered.   Impression and Recommendations:     This case required medical decision making of moderate complexity.

## 2015-03-01 NOTE — Assessment & Plan Note (Signed)
xrays pending, likely muscle injury, potential for stress fracture.  HEP, icing, topical NSAIDs, no limitation at work, discussed if worsening symptoms to return as well.  RTC in 4 weeks.

## 2015-03-01 NOTE — Patient Instructions (Addendum)
Good to see you.  xrays downstairs Ice 20 minutes 2 times daily. Usually after activity and before bed. Exercises 3 times a week.  NAPROXEN 500MG  TWICE DAILY Vitamin D 4000 IU daily for 2 weeks then 2000 IU daily thereafter Thigh compression sleeve at [facility] or [facility] See me again in 3 weeks.

## 2015-03-01 NOTE — Progress Notes (Signed)
Pre visit review using our clinic review tool, if applicable. No additional management support is needed unless otherwise documented below in the visit note. 

## 2015-03-02 ENCOUNTER — Encounter: Payer: Self-pay | Admitting: Family Medicine

## 2015-03-22 ENCOUNTER — Ambulatory Visit (INDEPENDENT_AMBULATORY_CARE_PROVIDER_SITE_OTHER): Payer: Federal, State, Local not specified - PPO | Admitting: Family Medicine

## 2015-03-22 ENCOUNTER — Encounter: Payer: Self-pay | Admitting: *Deleted

## 2015-03-22 ENCOUNTER — Encounter: Payer: Self-pay | Admitting: Family Medicine

## 2015-03-22 VITALS — BP 118/62 | HR 87 | Ht 66.0 in | Wt 135.0 lb

## 2015-03-22 DIAGNOSIS — S76011D Strain of muscle, fascia and tendon of right hip, subsequent encounter: Secondary | ICD-10-CM

## 2015-03-22 MED ORDER — NITROGLYCERIN 0.2 MG/HR TD PT24: MEDICATED_PATCH | TRANSDERMAL | Status: DC

## 2015-03-22 NOTE — Progress Notes (Signed)
Tawana ScaleZach Veronnica Hennings D.O. [facility] [address] [place], [place] [address] Phone: [phone] Subjective:    I'm seeing this patient by the request  of:  Oliver BarreJames John, MD   CC: right groin pain follow up  GNF:[id number]:Subjective Venetia NightSusan Sandlin is a 59 y.o. female coming in with complaint of right groin pain. Patient was seen previously and was diagnosed with more of a hip flexor tendinitis. Patient did have x-rays that were unremarkable for any bony normality. Patient states that the pain seems to still be there. Not as constant. Still gives her a sharp pain that seems last seconds. Because away fairly quickly. No association with food. Denies any fevers chills or any abnormal weight loss. Patient like to just no other days. Not stopping her from any daily activities at this time.  Past Medical History  Diagnosis Date  . HYPERTENSION 07/14/2010  . Renal artery stenosis, native, bilateral (HCC) 07/28/2010  . FOOT PAIN, BILATERAL 03/30/2010  . SMOKER 07/14/2010  . ANXIETY, SITUATIONAL 07/14/2010  . Glaucoma     sees optho/Dr. Dione BoozeGroat every 6 months  . Rosacea   . Palpitations   . Glaucoma (increased eye pressure) 02/01/2012   Past Surgical History  Procedure Laterality Date  . Dilation and curettage of uterus      s/p  . Tubal ligation    . Oophorectomy      left only 2012, non malignant mass   Social History  Substance Use Topics  . Smoking status: Never Smoker   . Smokeless tobacco: None  . Alcohol Use: None   No Known Allergies      Past medical history, social, surgical and family history all reviewed in electronic medical record.   Review of Systems: No headache, visual changes, nausea, vomiting, diarrhea, constipation, dizziness, abdominal pain, skin rash, fevers, chills, night sweats, weight loss, swollen lymph nodes, body aches, joint swelling, muscle aches, chest pain, shortness of breath, mood changes.   Objective Blood pressure 118/62, pulse 87, weight 135 lb (61.236  kg), SpO2 97 %.  General: No apparent distress alert and oriented x3 mood and affect normal, dressed appropriately.  HEENT: Pupils equal, extraocular movements intact  Respiratory: Patient's speak in full sentences and does not appear short of breath  Cardiovascular: No lower extremity edema, non tender, no erythema  Skin: Warm dry intact with no signs of infection or rash on extremities or on axial skeleton.  Abdomen: Soft nontender  Neuro: Cranial nerves II through XII are intact, neurovascularly intact in all extremities with 2+ DTRs and 2+ pulses.  Lymph: No lymphadenopathy of posterior or anterior cervical chain or axillae bilaterally.  Gait normal with good balance and coordination.  MSK:  Non tender with full range of motion and good stability and symmetric strength and tone of shoulders, elbows, wrist,  knee and ankles bilaterally.  Hip: Right ROM IR: 25 Deg, ER: 45 Deg, Flexion: 120 Deg, Extension: 100 Deg, Abduction: 45 Deg, Adduction: 45 Deg Strength IR: 5/5, ER: 5/5, Flexion: 5/5, Extension: 4/5, Abduction: 5/5, Adduction: 5/5 Pelvic alignment unremarkable to inspection and palpation. Standing hip rotation and gait without trendelenburg sign / unsteadiness. Greater trochanter without tenderness to palpation. No tenderness over piriformis and greater trochanter. No pain with FABER or FADIR. No SI joint tenderness and normal minimal SI movement. Continued mild tightness of the right hip flexor Contralateral hip unremarkable     Impression and Recommendations:     This case required medical decision making of moderate complexity.

## 2015-03-22 NOTE — Patient Instructions (Signed)
Good to see you Ice at night Continue the exercises  See me again in 6 weeks Nitroglycerin Protocol   Apply 1/4 nitroglycerin patch to affected area daily.  Change position of patch within the affected area every 24 hours.  You may experience a headache during the first 1-2 weeks of using the patch, these should subside.  If you experience headaches after beginning nitroglycerin patch treatment, you may take your preferred over the counter pain reliever.  Another side effect of the nitroglycerin patch is skin irritation or rash related to patch adhesive.  Please notify our office if you develop more severe headaches or rash, and stop the patch.  Tendon healing with nitroglycerin patch may require 12 to 24 weeks depending on the extent of injury.  Men should not use if taking Viagra, Cialis, or Levitra.   Do not use if you have migraines or rosacea.

## 2015-03-22 NOTE — Assessment & Plan Note (Signed)
Still most likely contributing factor. Differential also includes a sports hernia, small hernia that is not palpated. Patient denies any bowel or bladder problems. Denies any fever, chills, any abnormal weight loss. Patient has elected try and nitroglycerin patches. Warned of potential side effects. Patient knows if she has any side effects she will discontinue it immediately. Patient continue with the home exercises in the icing. Patient will continue to remain active. Patient will come back again in 6 weeks for further evaluation and treatment.

## 2015-03-22 NOTE — Progress Notes (Signed)
Pre visit review using our clinic review tool, if applicable. No additional management support is needed unless otherwise documented below in the visit note. 

## 2015-05-06 ENCOUNTER — Telehealth: Payer: Self-pay | Admitting: *Deleted

## 2015-05-06 DIAGNOSIS — M5441 Lumbago with sciatica, right side: Secondary | ICD-10-CM

## 2015-05-06 NOTE — Telephone Encounter (Signed)
Left msg on triage stating she is still having the ongoing hip pain now its going into her back. Pt is wanting to get an referral to see Dr. Dutch QuintPoole...Raechel Chute/lmb

## 2015-05-07 NOTE — Telephone Encounter (Signed)
Notified pt md has place referral../lmb 

## 2015-05-07 NOTE — Telephone Encounter (Signed)
Referral done

## 2015-08-02 ENCOUNTER — Other Ambulatory Visit (INDEPENDENT_AMBULATORY_CARE_PROVIDER_SITE_OTHER): Payer: Federal, State, Local not specified - PPO

## 2015-08-02 ENCOUNTER — Ambulatory Visit (INDEPENDENT_AMBULATORY_CARE_PROVIDER_SITE_OTHER): Payer: Federal, State, Local not specified - PPO | Admitting: Internal Medicine

## 2015-08-02 ENCOUNTER — Encounter: Payer: Self-pay | Admitting: Internal Medicine

## 2015-08-02 VITALS — BP 104/62 | HR 78 | Temp 98.2°F | Ht 66.0 in | Wt 135.0 lb

## 2015-08-02 DIAGNOSIS — R1030 Lower abdominal pain, unspecified: Secondary | ICD-10-CM

## 2015-08-02 DIAGNOSIS — R1031 Right lower quadrant pain: Secondary | ICD-10-CM

## 2015-08-02 LAB — HEPATIC FUNCTION PANEL
ALBUMIN: 4.6 g/dL (ref 3.5–5.2)
ALT: 11 U/L (ref 0–35)
AST: 15 U/L (ref 0–37)
Alkaline Phosphatase: 43 U/L (ref 39–117)
Bilirubin, Direct: 0.1 mg/dL (ref 0.0–0.3)
TOTAL PROTEIN: 7.1 g/dL (ref 6.0–8.3)
Total Bilirubin: 0.4 mg/dL (ref 0.2–1.2)

## 2015-08-02 LAB — CBC WITH DIFFERENTIAL/PLATELET
Basophils Absolute: 0 10*3/uL (ref 0.0–0.1)
Basophils Relative: 0.4 % (ref 0.0–3.0)
EOS PCT: 0.9 % (ref 0.0–5.0)
Eosinophils Absolute: 0.1 10*3/uL (ref 0.0–0.7)
HEMATOCRIT: 37.3 % (ref 36.0–46.0)
Hemoglobin: 12.6 g/dL (ref 12.0–15.0)
LYMPHS ABS: 1.6 10*3/uL (ref 0.7–4.0)
Lymphocytes Relative: 16.6 % (ref 12.0–46.0)
MCHC: 33.8 g/dL (ref 30.0–36.0)
MCV: 88.3 fl (ref 78.0–100.0)
MONOS PCT: 5.7 % (ref 3.0–12.0)
Monocytes Absolute: 0.6 10*3/uL (ref 0.1–1.0)
NEUTROS ABS: 7.4 10*3/uL (ref 1.4–7.7)
NEUTROS PCT: 76.4 % (ref 43.0–77.0)
PLATELETS: 260 10*3/uL (ref 150.0–400.0)
RBC: 4.22 Mil/uL (ref 3.87–5.11)
RDW: 13.5 % (ref 11.5–15.5)
WBC: 9.8 10*3/uL (ref 4.0–10.5)

## 2015-08-02 LAB — BASIC METABOLIC PANEL
BUN: 15 mg/dL (ref 6–23)
CALCIUM: 9.9 mg/dL (ref 8.4–10.5)
CO2: 32 meq/L (ref 19–32)
Chloride: 102 mEq/L (ref 96–112)
Creatinine, Ser: 0.68 mg/dL (ref 0.40–1.20)
GFR: 94.59 mL/min (ref >60.00)
Glucose, Bld: 118 mg/dL — ABNORMAL HIGH (ref 70–99)
Potassium: 4.8 mEq/L (ref 3.5–5.1)
SODIUM: 139 meq/L (ref 135–145)

## 2015-08-02 LAB — RHEUMATOID FACTOR

## 2015-08-02 LAB — C-REACTIVE PROTEIN: CRP: 0.2 mg/dL — AB (ref 0.5–20.0)

## 2015-08-02 LAB — SEDIMENTATION RATE: SED RATE: 9 mm/h (ref 0–22)

## 2015-08-02 MED ORDER — CYCLOBENZAPRINE HCL 5 MG PO TABS: 5.0000 mg | ORAL_TABLET | Freq: Three times a day (TID) | ORAL | Status: DC | PRN

## 2015-08-02 NOTE — Patient Instructions (Signed)
Please take all new medication as prescribed - the muscle relaxer as needed  Please continue all other medications as before, and refills have been done if requested.  Please have the pharmacy call with any other refills you may need.  Please keep your appointments with your specialists as you may have planned  Please go to the LAB in the Basement (turn left off the elevator) for the tests to be done today  You will be contacted by phone if any changes need to be made immediately.  Otherwise, you will receive a letter about your results with an explanation, but please check with MyChart first.  Please remember to sign up for MyChart if you have not done so, as this will be important to you in the future with finding out test results, communicating by private email, and scheduling acute appointments online when needed.

## 2015-08-02 NOTE — Progress Notes (Signed)
   Subjective:    Patient ID: Jane Mcdonald, female    DOB: February 19, 1956, 60 y.o.   MRN: [id number]  HPI  Here to f/u, overall not resolved right lower back pain /hip strain, seen per GYN (neg pelvic), gen surgury (no hernia) , then NS and ortho, as well Dr Smith/sport medicine, still feels like inflated ballon that can be bent, not better with ice and stretching.  Had neg MRI for lumbar spine per Dr Poole/NS  - ? Labrum tear.  So saw Dr Swimtec/[place] [facility] - neg right hip MRI as well.    Has not tried muscle relaxer.  Did not take nsiad per Dr Katrinka BlazingSmith after she read potential side effects, did not use the ntg patch, became wary of possible HA and lighthededness. Past Medical History  Diagnosis Date  . HYPERTENSION 07/14/2010  . Renal artery stenosis, native, bilateral (HCC) 07/28/2010  . FOOT PAIN, BILATERAL 03/30/2010  . SMOKER 07/14/2010  . ANXIETY, SITUATIONAL 07/14/2010  . Glaucoma     sees optho/Dr. Dione BoozeGroat every 6 months  . Rosacea   . Palpitations   . Glaucoma (increased eye pressure) 02/01/2012   Past Surgical History  Procedure Laterality Date  . Dilation and curettage of uterus      s/p  . Tubal ligation    . Oophorectomy      left only 2012, non malignant mass    reports that she has never smoked. She does not have any smokeless tobacco history on file. Her alcohol and drug histories are not on file. family history includes Cancer in her other; Heart disease in her father; Hypertension in her mother; Nephrolithiasis in her father. No Known Allergies Current Outpatient Prescriptions on File Prior to Visit  Medication Sig Dispense Refill  . aspirin 81 MG EC tablet Take 1 tablet (81 mg total) by mouth daily. Swallow whole. 30 tablet 12  . latanoprost (XALATAN) 0.005 % ophthalmic solution 1 drop. Use as directed    . lisinopril (PRINIVIL,ZESTRIL) 20 MG tablet Take 1 tablet (20 mg total) by mouth daily. 90 tablet 3  . valACYclovir (VALTREX) 500 MG tablet Take 500 mg by mouth daily.         No current facility-administered medications on file prior to visit.    Review of Systems All otherwise neg per pt     Objective:   Physical Exam BP 104/62 mmHg  Pulse 78  Temp(Src) 98.2 F (36.8 C) (Oral)  Ht 5\' 6"  (1.676 m)  Wt 135 lb (61.236 kg)  BMI 21.80 kg/m2  SpO2 98% VS noted,  Constitutional: Pt appears in no apparent distress HENT: Head: NCAT.  Right Ear: External ear normal.  Left Ear: External ear normal.  Eyes: . Pupils are equal, round, and reactive to light. Conjunctivae and EOM are normal Neck: Normal range of motion. Neck supple.  Cardiovascular: Normal rate and regular rhythm.   Pulmonary/Chest: Effort normal and breath sounds without rales or wheezing.  Abd:  Soft, NT, ND, + BS Neurological: Pt is alert. Not confused , motor grossly intact Skin: Skin is warm. No rash, no LE edema Psychiatric: Pt behavior is normal. No agitation.  Right hip flexion FROM,NT over lower back and right lateral greater trochanter, spine nontender    Assessment & Plan:

## 2015-08-02 NOTE — Progress Notes (Signed)
Pre visit review using our clinic review tool, if applicable. No additional management support is needed unless otherwise documented below in the visit note. 

## 2015-08-03 LAB — ANTI-NUCLEAR AB-TITER (ANA TITER): ANA Titer 1: 1:80 {titer} — ABNORMAL HIGH

## 2015-08-03 LAB — ANA: Anti Nuclear Antibody(ANA): POSITIVE — AB

## 2015-08-03 LAB — CYCLIC CITRUL PEPTIDE ANTIBODY, IGG

## 2015-08-03 NOTE — Assessment & Plan Note (Signed)
Most likely c/w mild MSK etiology, for trial flexeril prn, for labs as documented,  to f/u any worsening symptoms or concerns

## 2015-08-04 ENCOUNTER — Other Ambulatory Visit: Payer: Self-pay | Admitting: Internal Medicine

## 2015-08-04 ENCOUNTER — Encounter: Payer: Self-pay | Admitting: Internal Medicine

## 2015-08-04 DIAGNOSIS — R768 Other specified abnormal immunological findings in serum: Secondary | ICD-10-CM

## 2015-08-05 ENCOUNTER — Telehealth: Payer: Self-pay | Admitting: Internal Medicine

## 2015-08-05 NOTE — Telephone Encounter (Signed)
Pt called in and would like the nurse to call her to go over labs .  Best number is cell number

## 2015-08-23 DIAGNOSIS — R799 Abnormal finding of blood chemistry, unspecified: Secondary | ICD-10-CM | POA: Diagnosis not present

## 2015-08-23 DIAGNOSIS — M25551 Pain in right hip: Secondary | ICD-10-CM | POA: Diagnosis not present

## 2015-08-23 DIAGNOSIS — M545 Low back pain: Secondary | ICD-10-CM | POA: Diagnosis not present

## 2015-08-25 DIAGNOSIS — H2513 Age-related nuclear cataract, bilateral: Secondary | ICD-10-CM | POA: Diagnosis not present

## 2015-08-25 DIAGNOSIS — H40053 Ocular hypertension, bilateral: Secondary | ICD-10-CM | POA: Diagnosis not present

## 2015-09-30 DIAGNOSIS — Z79899 Other long term (current) drug therapy: Secondary | ICD-10-CM | POA: Diagnosis not present

## 2015-09-30 DIAGNOSIS — N189 Chronic kidney disease, unspecified: Secondary | ICD-10-CM | POA: Diagnosis not present

## 2015-09-30 DIAGNOSIS — Z7982 Long term (current) use of aspirin: Secondary | ICD-10-CM | POA: Diagnosis not present

## 2015-09-30 DIAGNOSIS — Z01419 Encounter for gynecological examination (general) (routine) without abnormal findings: Secondary | ICD-10-CM | POA: Diagnosis not present

## 2015-09-30 DIAGNOSIS — Z87891 Personal history of nicotine dependence: Secondary | ICD-10-CM | POA: Diagnosis not present

## 2015-09-30 DIAGNOSIS — R1031 Right lower quadrant pain: Secondary | ICD-10-CM | POA: Diagnosis not present

## 2015-09-30 DIAGNOSIS — I129 Hypertensive chronic kidney disease with stage 1 through stage 4 chronic kidney disease, or unspecified chronic kidney disease: Secondary | ICD-10-CM | POA: Diagnosis not present

## 2015-10-26 DIAGNOSIS — R1031 Right lower quadrant pain: Secondary | ICD-10-CM | POA: Diagnosis not present

## 2015-10-27 ENCOUNTER — Telehealth: Payer: Self-pay | Admitting: Internal Medicine

## 2015-10-27 NOTE — Telephone Encounter (Signed)
[facility] forward 2 pages to Dr.John

## 2015-11-02 DIAGNOSIS — N200 Calculus of kidney: Secondary | ICD-10-CM | POA: Diagnosis not present

## 2015-11-03 DIAGNOSIS — K080 Exfoliation of teeth due to systemic causes: Secondary | ICD-10-CM | POA: Diagnosis not present

## 2015-11-04 ENCOUNTER — Telehealth: Payer: Self-pay | Admitting: Internal Medicine

## 2015-11-04 NOTE — Telephone Encounter (Signed)
Rec'd [facility] forward 2 pages to Dr.John

## 2016-01-12 DIAGNOSIS — H01022 Squamous blepharitis right lower eyelid: Secondary | ICD-10-CM | POA: Diagnosis not present

## 2016-01-12 DIAGNOSIS — H01024 Squamous blepharitis left upper eyelid: Secondary | ICD-10-CM | POA: Diagnosis not present

## 2016-01-12 DIAGNOSIS — H01021 Squamous blepharitis right upper eyelid: Secondary | ICD-10-CM | POA: Diagnosis not present

## 2016-01-12 DIAGNOSIS — H0012 Chalazion right lower eyelid: Secondary | ICD-10-CM | POA: Diagnosis not present

## 2016-02-09 DIAGNOSIS — Z1231 Encounter for screening mammogram for malignant neoplasm of breast: Secondary | ICD-10-CM | POA: Diagnosis not present

## 2016-02-22 ENCOUNTER — Encounter: Payer: Federal, State, Local not specified - PPO | Admitting: Internal Medicine

## 2016-03-03 ENCOUNTER — Telehealth: Payer: Self-pay

## 2016-03-03 ENCOUNTER — Other Ambulatory Visit (INDEPENDENT_AMBULATORY_CARE_PROVIDER_SITE_OTHER): Payer: Federal, State, Local not specified - PPO

## 2016-03-03 DIAGNOSIS — Z0000 Encounter for general adult medical examination without abnormal findings: Secondary | ICD-10-CM

## 2016-03-03 LAB — LIPID PANEL
CHOL/HDL RATIO: 2
Cholesterol: 130 mg/dL (ref 0–200)
HDL: 60.4 mg/dL (ref >39.00)
LDL Cholesterol: 49 mg/dL (ref 0–99)
NONHDL: 69.36
TRIGLYCERIDES: 101 mg/dL (ref 0.0–149.0)
VLDL: 20.2 mg/dL (ref 0.0–40.0)

## 2016-03-03 LAB — HEPATIC FUNCTION PANEL
ALBUMIN: 4.5 g/dL (ref 3.5–5.2)
ALK PHOS: 45 U/L (ref 39–117)
ALT: 10 U/L (ref 0–35)
AST: 15 U/L (ref 0–37)
Bilirubin, Direct: 0.1 mg/dL (ref 0.0–0.3)
TOTAL PROTEIN: 7.1 g/dL (ref 6.0–8.3)
Total Bilirubin: 0.6 mg/dL (ref 0.2–1.2)

## 2016-03-03 LAB — CBC
HCT: 35.5 % — ABNORMAL LOW (ref 36.0–46.0)
HEMOGLOBIN: 12.1 g/dL (ref 12.0–15.0)
MCHC: 34.1 g/dL (ref 30.0–36.0)
MCV: 87.6 fl (ref 78.0–100.0)
PLATELETS: 241 10*3/uL (ref 150.0–400.0)
RBC: 4.05 Mil/uL (ref 3.87–5.11)
RDW: 13.6 % (ref 11.5–15.5)
WBC: 7 10*3/uL (ref 4.0–10.5)

## 2016-03-03 LAB — BASIC METABOLIC PANEL
BUN: 13 mg/dL (ref 6–23)
CHLORIDE: 102 meq/L (ref 96–112)
CO2: 31 mEq/L (ref 19–32)
CREATININE: 0.72 mg/dL (ref 0.40–1.20)
Calcium: 9.7 mg/dL (ref 8.4–10.5)
GFR: 88.37 mL/min (ref >60.00)
GLUCOSE: 89 mg/dL (ref 70–99)
POTASSIUM: 4.3 meq/L (ref 3.5–5.1)
Sodium: 139 mEq/L (ref 135–145)

## 2016-03-03 LAB — MICROALBUMIN / CREATININE URINE RATIO
Creatinine,U: 28.4 mg/dL
Microalb Creat Ratio: 2.5 mg/g (ref 0.0–30.0)

## 2016-03-03 LAB — HEMOGLOBIN A1C: HEMOGLOBIN A1C: 5.5 % (ref 4.6–6.5)

## 2016-03-03 NOTE — Telephone Encounter (Signed)
Labs placed.

## 2016-03-06 LAB — TSH: TSH: 1.5 u[IU]/mL (ref 0.35–4.50)

## 2016-03-10 ENCOUNTER — Encounter: Payer: Self-pay | Admitting: Internal Medicine

## 2016-03-10 ENCOUNTER — Ambulatory Visit (INDEPENDENT_AMBULATORY_CARE_PROVIDER_SITE_OTHER): Payer: Federal, State, Local not specified - PPO | Admitting: Internal Medicine

## 2016-03-10 ENCOUNTER — Other Ambulatory Visit: Payer: Self-pay

## 2016-03-10 VITALS — BP 130/72 | HR 77 | Temp 98.0°F | Resp 20 | Wt 131.0 lb

## 2016-03-10 DIAGNOSIS — Z0000 Encounter for general adult medical examination without abnormal findings: Secondary | ICD-10-CM

## 2016-03-10 DIAGNOSIS — H9192 Unspecified hearing loss, left ear: Secondary | ICD-10-CM | POA: Insufficient documentation

## 2016-03-10 DIAGNOSIS — I701 Atherosclerosis of renal artery: Secondary | ICD-10-CM | POA: Diagnosis not present

## 2016-03-10 MED ORDER — LISINOPRIL 20 MG PO TABS: 20.0000 mg | ORAL_TABLET | Freq: Every day | ORAL | 3 refills | Status: DC

## 2016-03-10 NOTE — Progress Notes (Signed)
Subjective:    Patient ID: Jane Mcdonald, female    DOB: June 28, 1955, 60 y.o.   MRN: [id number]  HPI    Here for wellness and f/u;  Overall doing ok;  Pt denies Chest pain, worsening SOB, DOE, wheezing, orthopnea, PND, worsening LE edema, palpitations, dizziness or syncope.  Pt denies neurological change such as new headache, facial or extremity weakness.  Pt denies polydipsia, polyuria, or low sugar symptoms. Pt states overall good compliance with treatment and medications, good tolerability, and has been trying to follow appropriate diet.  Pt denies worsening depressive symptoms, suicidal ideation or panic. No fever, night sweats, wt loss, loss of appetite, or other constitutional symptoms.  Pt states good ability with ADL's, has low fall risk, home safety reviewed and adequate, no other significant changes in hearing or vision, and only occasionally active with exercise.  Due for f/u RAS , has seen vasc with Dr Cooper/card in past, [facility] but asks for further f/u locally.  BP has been controlled recently.  Hs some left hearing loss - ? wax Past Medical History:  Diagnosis Date  . ANXIETY, SITUATIONAL 07/14/2010  . FOOT PAIN, BILATERAL 03/30/2010  . Glaucoma    sees optho/Dr. Dione BoozeGroat every 6 months  . Glaucoma (increased eye pressure) 02/01/2012  . HYPERTENSION 07/14/2010  . Palpitations   . Renal artery stenosis, native, bilateral (HCC) 07/28/2010  . Rosacea   . SMOKER 07/14/2010   Past Surgical History:  Procedure Laterality Date  . DILATION AND CURETTAGE OF UTERUS     s/p  . OOPHORECTOMY     left only 2012, non malignant mass  . TUBAL LIGATION      reports that she has never smoked. She does not have any smokeless tobacco history on file. Her alcohol and drug histories are not on file. family history includes Cancer in her other; Heart disease in her father; Hypertension in her mother; Nephrolithiasis in her father. No Known Allergies Current Outpatient Prescriptions on File Prior  to Visit  Medication Sig Dispense Refill  . aspirin 81 MG EC tablet Take 1 tablet (81 mg total) by mouth daily. Swallow whole. 30 tablet 12  . latanoprost (XALATAN) 0.005 % ophthalmic solution 1 drop. Use as directed    . valACYclovir (VALTREX) 500 MG tablet Take 500 mg by mouth daily.      . cyclobenzaprine (FLEXERIL) 5 MG tablet Take 1 tablet (5 mg total) by mouth 3 (three) times daily as needed for muscle spasms. (Patient not taking: Reported on 03/10/2016) 60 tablet 1  . Multiple Minerals-Vitamins (CALCIUM & VIT D3 BONE HEALTH) LIQD Take by mouth.    . Turmeric Curcumin 500 MG CAPS Take by mouth.     No current facility-administered medications on file prior to visit.    Review of Systems Constitutional: Negative for increased diaphoresis, or other activity, appetite or siginficant weight change other than noted HENT: Negative for worsening hearing loss, ear pain, facial swelling, mouth sores and neck stiffness.   Eyes: Negative for other worsening pain, redness or visual disturbance.  Respiratory: Negative for choking or stridor Cardiovascular: Negative for other chest pain and palpitations.  Gastrointestinal: Negative for worsening diarrhea, blood in stool, or abdominal distention Genitourinary: Negative for hematuria, flank pain or change in urine volume.  Musculoskeletal: Negative for myalgias or other joint complaints.  Skin: Negative for other color change and wound or drainage.  Neurological: Negative for syncope and numbness. other than noted Hematological: Negative for adenopathy. or other swelling  Psychiatric/Behavioral: Negative for hallucinations, SI, self-injury, decreased concentration or other worsening agitation.  All other system neg per pt    Objective:   Physical Exam BP 130/72   Pulse 77   Temp 98 F (36.7 C) (Oral)   Resp 20   Wt 131 lb (59.4 kg)   SpO2 98%   BMI 21.14 kg/m  VS noted,  Constitutional: Pt is oriented to person, place, and time. Appears  well-developed and well-nourished, in no significant distress Head: Normocephalic and atraumatic  Eyes: Conjunctivae and EOM are normal. Pupils are equal, round, and reactive to light Right Ear: External ear normal.  Left Ear: External ear normal Nose: Nose normal.  Left ear canal irrigated of wax Mouth/Throat: Oropharynx is clear and moist  Neck: Normal range of motion. Neck supple. No JVD present. No tracheal deviation present or significant neck LA or mass Cardiovascular: Normal rate, regular rhythm, normal heart sounds and intact distal pulses.   Pulmonary/Chest: Effort normal and breath sounds without rales or wheezing  Abdominal: Soft. Bowel sounds are normal. NT. No HSM , no abd bruit Musculoskeletal: Normal range of motion. Exhibits no edema Lymphadenopathy: Has no cervical adenopathy.  Neurological: Pt is alert and oriented to person, place, and time. Pt has normal reflexes. No cranial nerve deficit. Motor grossly intact Skin: Skin is warm and dry. No rash noted or new ulcers Psychiatric:  Has normal mood and affect. Behavior is normal.  No other new physical exam findings.      Assessment & Plan:

## 2016-03-10 NOTE — Progress Notes (Signed)
printed

## 2016-03-10 NOTE — Patient Instructions (Addendum)
You had the flu shot today  Your left ear was irrigated of wax today  Please continue all other medications as before, and refills have been done if requested.  Please have the pharmacy call with any other refills you may need.  Please continue your efforts at being more active, low cholesterol diet, and weight control.  You are otherwise up to date with prevention measures today.  Please keep your appointments with your specialists as you may have planned  You will be contacted regarding the referral for: Renal Artery duplex, and the Vascular referral  Please return in 1 year for your yearly visit, or sooner if needed, with Lab testing done 3-5 days before

## 2016-03-10 NOTE — Progress Notes (Signed)
Pre visit review using our clinic review tool, if applicable. No additional management support is needed unless otherwise documented below in the visit note. 

## 2016-03-12 NOTE — Assessment & Plan Note (Signed)

## 2016-03-12 NOTE — Assessment & Plan Note (Signed)
BP ok, for renal duplex f/u and local vascular surgury referral

## 2016-03-20 ENCOUNTER — Other Ambulatory Visit: Payer: Self-pay | Admitting: *Deleted

## 2016-04-26 DIAGNOSIS — K080 Exfoliation of teeth due to systemic causes: Secondary | ICD-10-CM | POA: Diagnosis not present

## 2016-04-27 ENCOUNTER — Encounter: Payer: Self-pay | Admitting: Vascular Surgery

## 2016-05-10 ENCOUNTER — Ambulatory Visit (HOSPITAL_COMMUNITY)
Admission: RE | Admit: 2016-05-10 | Discharge: 2016-05-10 | Disposition: A | Discharge status: Home / Self Care | Payer: Federal, State, Local not specified - PPO | Source: Ambulatory Visit | Attending: Vascular Surgery | Admitting: Vascular Surgery

## 2016-05-10 ENCOUNTER — Encounter: Payer: Federal, State, Local not specified - PPO | Admitting: Vascular Surgery

## 2016-05-10 ENCOUNTER — Ambulatory Visit (INDEPENDENT_AMBULATORY_CARE_PROVIDER_SITE_OTHER): Payer: Federal, State, Local not specified - PPO | Admitting: Vascular Surgery

## 2016-05-10 ENCOUNTER — Encounter (HOSPITAL_COMMUNITY): Payer: Federal, State, Local not specified - PPO

## 2016-05-10 ENCOUNTER — Encounter: Payer: Self-pay | Admitting: *Deleted

## 2016-05-10 ENCOUNTER — Encounter: Payer: Self-pay | Admitting: Vascular Surgery

## 2016-05-10 VITALS — BP 119/77 | HR 72 | Temp 97.4°F | Resp 16 | Ht 64.0 in | Wt 126.0 lb

## 2016-05-10 DIAGNOSIS — I83029 Varicose veins of left lower extremity with ulcer of unspecified site: Secondary | ICD-10-CM | POA: Diagnosis not present

## 2016-05-10 DIAGNOSIS — I701 Atherosclerosis of renal artery: Secondary | ICD-10-CM

## 2016-05-10 DIAGNOSIS — L97929 Non-pressure chronic ulcer of unspecified part of left lower leg with unspecified severity: Secondary | ICD-10-CM

## 2016-05-10 DIAGNOSIS — I7789 Other specified disorders of arteries and arterioles: Secondary | ICD-10-CM | POA: Diagnosis not present

## 2016-05-10 DIAGNOSIS — I773 Arterial fibromuscular dysplasia: Secondary | ICD-10-CM

## 2016-05-10 NOTE — Progress Notes (Signed)
Patient ID: Jane Mcdonald Ertl, female   DOB: November 12, 1955, 61 y.o.   MRN: [id number]  Reason for Consult: New Evaluation (bilateral renal artery stenosis)   Referred by Corwin LevinsJohn, James W, MD  Subjective:     HPI:  Jane Mcdonald Markes is a 61 y.o. female with history of fibromuscular dysplasia admitting diagnosis years ago at [facility] She is now sent for evaluation of her renal artery stenosis and continuing care of the renal artery disease that [place]. From a blood pressure standpoint she has been stable and very well controlled on single agent lisinopril with a systolic pressure 119 today. She does not have any worsening renal function throughout history or any evidence of pulmonary edema and has never had intervention of her renal artery stenosis. She also does not have any evidence of stroke or strokelike symptoms in the past. Her only complaints today centered around her left lower extremity varicosities. 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 She does wear knee-high compression stockings over-the-counter which do help with this somewhat.  Past Medical History:  Diagnosis Date  . ANXIETY, SITUATIONAL 07/14/2010  . FOOT PAIN, BILATERAL 03/30/2010  . Glaucoma    sees optho/Dr. Dione BoozeGroat every 6 months  . Glaucoma (increased eye pressure) 02/01/2012  . HYPERTENSION 07/14/2010  . Palpitations   . Renal artery stenosis, native, bilateral (HCC) 07/28/2010  . Rosacea   . SMOKER 07/14/2010   Family History  Problem Relation Age of Onset  . Hypertension Mother   . Heart disease Father   . Nephrolithiasis Father   . Cancer Other     ENT Cancer   Past Surgical History:    Procedure Laterality Date  . DILATION AND CURETTAGE OF UTERUS     s/p  . OOPHORECTOMY     left only 2012, non malignant mass  . TUBAL LIGATION      Short Social History:  Social History  Substance Use Topics  . Smoking status: Never Smoker  . Smokeless tobacco: Never Used  . Alcohol use Not on file    No Known Allergies  Current Outpatient Prescriptions  Medication Sig Dispense Refill  . aspirin 81 MG EC tablet Take 1 tablet (81 mg total) by mouth daily. Swallow whole. 30 tablet 12  . latanoprost (XALATAN) 0.005 % ophthalmic solution 1 drop. Use as directed    . lisinopril (PRINIVIL,ZESTRIL) 20 MG tablet Take 1 tablet (20 mg total) by mouth daily. 90 tablet 3  . valACYclovir (VALTREX) 500 MG tablet Take 500 mg by mouth daily.      . cyclobenzaprine (FLEXERIL) 5 MG tablet Take 1 tablet (5 mg total) by mouth 3 (three) times daily as needed for muscle spasms. (Patient not taking: Reported on 05/10/2016) 60 tablet 1  . Multiple Minerals-Vitamins (CALCIUM & VIT D3 BONE HEALTH) LIQD Take by mouth.    . Turmeric Curcumin 500 MG CAPS Take by mouth.     No current facility-administered medications for this visit.     Review of Systems  Constitutional:  Constitutional negative. Eyes: Eyes negative.  Respiratory: Respiratory negative.  Cardiovascular: Cardiovascular negative.  GI: Gastrointestinal negative.  Musculoskeletal:  Left leg varicose veins Skin: Skin negative.  Neurological: Neurological negative. Hematologic: Hematologic/lymphatic negative.  Psychiatric: Psychiatric negative.        Objective:  Objective   Vitals:   05/10/16 1117  BP: 119/77  Pulse: 72  Resp: 16  Temp: 97.4 F (36.3 C)  SpO2: 99%  Weight: 126 lb (57.2 kg)  Height: 5\' 4"  (1.626 m)   Body mass index is 21.63 kg/m.  Physical Exam  Constitutional: She is oriented to person, place, and time. She appears well-developed.  HENT:  Head: Atraumatic.  Eyes: Pupils are equal, round, and  reactive to light.  Neck: Normal range of motion.  Cardiovascular: Normal rate.   Pulses:      Radial pulses are 2+ on the right side, and 2+ on the left side.       Femoral pulses are 2+ on the right side, and 2+ on the left side.      Dorsalis pedis pulses are 2+ on the right side, and 2+ on the left side.       Posterior tibial pulses are 2+ on the right side, and 2+ on the left side.  Pulmonary/Chest: Effort normal. She has no wheezes.  Abdominal: Soft. She exhibits no mass.  Musculoskeletal: Normal range of motion.  Left medial leg varicose veins  Neurological: She is alert and oriented to person, place, and time.  Skin: Skin is warm and dry.  Psychiatric: She has a normal mood and affect. Her behavior is normal. Judgment and thought content normal.    Data: Renal artery duplex demonstrated greater than 60% stenosis in the mid right renal artery with peak systolic velocity 279 cm/s.     Assessment/Plan:   61 year-old female sent here with fibromuscular dysplasia for continued evaluation of her renal artery stenosis which is on the right. She is on single agent antihypertensive with lisinopril and she is very well controlled with no other common locations from renal artery stenosis. I reviewed an old CT scan on her and she has no atherosclerotic disease and does apparently have FMD. We will have her follow up in 1 year with renal artery duplex and she will continue her lisinopril. Her chief complaint today is actually of her left lower extremity varicosities. 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 Arbie Cookey or Hart Rochester with lower extremity reflux testing in 3 months time.     Maeola Harman MD [facility]

## 2016-05-10 NOTE — Addendum Note (Signed)
Addended by: Burton ApleyPETTY, Marcelle Hepner A on: 05/10/2016 04:40 PM   Modules accepted: Orders

## 2016-05-16 ENCOUNTER — Other Ambulatory Visit: Payer: Self-pay | Admitting: Internal Medicine

## 2016-05-19 ENCOUNTER — Telehealth: Payer: Self-pay

## 2016-05-19 ENCOUNTER — Other Ambulatory Visit: Payer: Self-pay | Admitting: Internal Medicine

## 2016-05-19 NOTE — Telephone Encounter (Signed)
[facility] and to confirm with them that they were ready for pick up.

## 2016-06-07 ENCOUNTER — Ambulatory Visit (INDEPENDENT_AMBULATORY_CARE_PROVIDER_SITE_OTHER)
Admission: RE | Admit: 2016-06-07 | Discharge: 2016-06-07 | Disposition: A | Discharge status: Home / Self Care | Payer: Federal, State, Local not specified - PPO | Source: Ambulatory Visit | Attending: Internal Medicine | Admitting: Internal Medicine

## 2016-06-07 ENCOUNTER — Ambulatory Visit (INDEPENDENT_AMBULATORY_CARE_PROVIDER_SITE_OTHER): Payer: Federal, State, Local not specified - PPO | Admitting: Internal Medicine

## 2016-06-07 VITALS — BP 128/80 | HR 90 | Temp 98.4°F | Resp 20 | Wt 123.0 lb

## 2016-06-07 DIAGNOSIS — R109 Unspecified abdominal pain: Secondary | ICD-10-CM | POA: Insufficient documentation

## 2016-06-07 DIAGNOSIS — R0781 Pleurodynia: Secondary | ICD-10-CM | POA: Diagnosis not present

## 2016-06-07 DIAGNOSIS — I10 Essential (primary) hypertension: Secondary | ICD-10-CM

## 2016-06-07 DIAGNOSIS — R079 Chest pain, unspecified: Secondary | ICD-10-CM | POA: Insufficient documentation

## 2016-06-07 DIAGNOSIS — R0789 Other chest pain: Secondary | ICD-10-CM

## 2016-06-07 MED ORDER — CYCLOBENZAPRINE HCL 5 MG PO TABS: 5.0000 mg | ORAL_TABLET | Freq: Three times a day (TID) | ORAL | 1 refills | Status: DC | PRN

## 2016-06-07 NOTE — Patient Instructions (Signed)
Please take all new medication as prescribed - the muscle relaxer as needed  Please continue all other medications as before, and refills have been done if requested.  Please have the pharmacy call with any other refills you may need.  Please keep your appointments with your specialists as you may have planned  Please go to the XRAY Department in the Basement (go straight as you get off the elevator) for the x-ray testing  You will be contacted by phone if any changes need to be made immediately.  Otherwise, you will receive a letter about your results with an explanation, but please check with MyChart first.  Please remember to sign up for MyChart if you have not done so, as this will be important to you in the future with finding out test results, communicating by private email, and scheduling acute appointments online when needed.  You are given the work note as well

## 2016-06-07 NOTE — Progress Notes (Signed)
Pre visit review using our clinic review tool, if applicable. No additional management support is needed unless otherwise documented below in the visit note. 

## 2016-06-07 NOTE — Assessment & Plan Note (Signed)
After fall, for muscle relaxer, rib films, cont advil prn, gave work note,  to f/u any worsening symptoms or concerns

## 2016-06-07 NOTE — Assessment & Plan Note (Signed)
stable overall by history and exam, recent data reviewed with pt, and pt to continue medical treatment as before,  to f/u any worsening symptoms or concerns BP Readings from Last 3 Encounters:  06/07/16 128/80  05/10/16 119/77  03/10/16 130/72

## 2016-06-07 NOTE — Assessment & Plan Note (Signed)
For cxr, but suspect pain more likely msk

## 2016-06-07 NOTE — Progress Notes (Signed)
Subjective:    Patient ID: Jane Mcdonald, female    DOB: 05/25/1955, 61 y.o.   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  Was not actually hit by the dog but lost her footing backing up, slipped and hit very hard to another persons vehicle before ending up on ground.  Has immediate pain, swelling and bruising to the right post chest and flank area, has not been able to return to work since then at the [employer] since this involves 8 hrs of recurring lifting activity.  Has been getting by on advil, but pain still 5/10, sharp, mild pleuritic, without radiation, and not assoc with sob, cough or fever. Denies urinary symptoms such as dysuria, frequency, urgency, flank pain, hematuria or n/v, fever, chills.  No other new history.  Needs work note. Past Medical History:  Diagnosis Date  . ANXIETY, SITUATIONAL 07/14/2010  . FOOT PAIN, BILATERAL 03/30/2010  . Glaucoma    sees optho/Dr. Dione Booze every 6 months  . Glaucoma (increased eye pressure) 02/01/2012  . HYPERTENSION 07/14/2010  . Palpitations   . Renal artery stenosis, native, bilateral (HCC) 07/28/2010  . Rosacea   . SMOKER 07/14/2010   Past Surgical History:  Procedure Laterality Date  . DILATION AND CURETTAGE OF UTERUS     s/p  . OOPHORECTOMY     left only 2012, non malignant mass  . TUBAL LIGATION      reports that she has never smoked. She has never used smokeless tobacco. Her alcohol and drug histories are not on file. family history includes Cancer in her other; Heart disease in her father; Hypertension in her mother; Nephrolithiasis in her father. No Known Allergies Current Outpatient Prescriptions on File Prior to Visit  Medication Sig Dispense Refill  . aspirin 81 MG EC tablet Take 1 tablet (81 mg total) by mouth daily. Swallow whole. 30 tablet 12  . latanoprost (XALATAN) 0.005  % ophthalmic solution 1 drop. Use as directed    . lisinopril (PRINIVIL,ZESTRIL) 20 MG tablet TAKE ONE TABLET BY MOUTH ONCE DAILY 90 tablet 3  . Multiple Minerals-Vitamins (CALCIUM & VIT D3 BONE HEALTH) LIQD Take by mouth.    . Turmeric Curcumin 500 MG CAPS Take by mouth.    . valACYclovir (VALTREX) 500 MG tablet Take 500 mg by mouth daily.       No current facility-administered medications on file prior to visit.    Review of Systems  Constitutional: Negative for unusual diaphoresis or night sweats HENT: Negative for ear swelling or discharge Eyes: Negative for worsening visual haziness  Respiratory: Negative for choking and stridor.   Gastrointestinal: Negative for distension or worsening eructation Genitourinary: Negative for retention or change in urine volume.  Musculoskeletal: Negative for other MSK pain or swelling Skin: Negative for color change and worsening wound Neurological: Negative for tremors and numbness other than noted  Psychiatric/Behavioral: Negative for decreased concentration or agitation other than above   All other system neg per pt    Objective:   Physical Exam BP 128/80   Pulse 90   Temp 98.4 F (36.9 C) (Oral)   Resp 20   Wt 123 lb (55.8 kg)   SpO2 98%   BMI 21.11 kg/m  VS noted,  Constitutional: Pt appears in no apparent distress HENT: Head: NCAT.  Right Ear:  External ear normal.  Left Ear: External ear normal.  Eyes: . Pupils are equal, round, and reactive to light. Conjunctivae and EOM are normal Neck: Normal range of motion. Neck supple.  Cardiovascular: Normal rate and regular rhythm.   Pulmonary/Chest: Effort normal and breath sounds without rales or wheezing.  Abd:  Soft, NT, ND, + BS Spine nontender midline without bruising or swelling Right flank/post lower chest with large approx 8 x 5 bruising now several days old with mild swelling, and at least mod tenderness, no obvious bony deformimty  Neurological: Pt is alert. Not confused ,  motor grossly intact Skin: Skin is warm. No rash, no LE edema Psychiatric: Pt behavior is normal. No agitation.  No other new exam findings    Assessment & Plan:

## 2016-06-08 ENCOUNTER — Encounter: Payer: Self-pay | Admitting: Internal Medicine

## 2016-08-30 DIAGNOSIS — H40053 Ocular hypertension, bilateral: Secondary | ICD-10-CM | POA: Diagnosis not present

## 2016-08-30 DIAGNOSIS — H2513 Age-related nuclear cataract, bilateral: Secondary | ICD-10-CM | POA: Diagnosis not present

## 2016-09-27 DIAGNOSIS — K080 Exfoliation of teeth due to systemic causes: Secondary | ICD-10-CM | POA: Diagnosis not present

## 2016-10-03 DIAGNOSIS — Z87891 Personal history of nicotine dependence: Secondary | ICD-10-CM | POA: Diagnosis not present

## 2016-10-03 DIAGNOSIS — I129 Hypertensive chronic kidney disease with stage 1 through stage 4 chronic kidney disease, or unspecified chronic kidney disease: Secondary | ICD-10-CM | POA: Diagnosis not present

## 2016-10-03 DIAGNOSIS — Z7982 Long term (current) use of aspirin: Secondary | ICD-10-CM | POA: Diagnosis not present

## 2016-10-03 DIAGNOSIS — N189 Chronic kidney disease, unspecified: Secondary | ICD-10-CM | POA: Diagnosis not present

## 2016-10-03 DIAGNOSIS — Z79899 Other long term (current) drug therapy: Secondary | ICD-10-CM | POA: Diagnosis not present

## 2016-10-03 DIAGNOSIS — Z780 Asymptomatic menopausal state: Secondary | ICD-10-CM | POA: Diagnosis not present

## 2016-10-03 DIAGNOSIS — Z01419 Encounter for gynecological examination (general) (routine) without abnormal findings: Secondary | ICD-10-CM | POA: Diagnosis not present

## 2016-10-16 DIAGNOSIS — K080 Exfoliation of teeth due to systemic causes: Secondary | ICD-10-CM | POA: Diagnosis not present

## 2016-10-23 DIAGNOSIS — L738 Other specified follicular disorders: Secondary | ICD-10-CM | POA: Diagnosis not present

## 2016-10-23 DIAGNOSIS — L570 Actinic keratosis: Secondary | ICD-10-CM | POA: Diagnosis not present

## 2016-10-23 DIAGNOSIS — L821 Other seborrheic keratosis: Secondary | ICD-10-CM | POA: Diagnosis not present

## 2016-10-23 DIAGNOSIS — C44529 Squamous cell carcinoma of skin of other part of trunk: Secondary | ICD-10-CM | POA: Diagnosis not present

## 2016-10-23 DIAGNOSIS — D224 Melanocytic nevi of scalp and neck: Secondary | ICD-10-CM | POA: Diagnosis not present

## 2016-10-23 DIAGNOSIS — D1801 Hemangioma of skin and subcutaneous tissue: Secondary | ICD-10-CM | POA: Diagnosis not present

## 2017-01-29 ENCOUNTER — Ambulatory Visit (INDEPENDENT_AMBULATORY_CARE_PROVIDER_SITE_OTHER): Payer: Federal, State, Local not specified - PPO | Admitting: Sports Medicine

## 2017-01-29 ENCOUNTER — Encounter: Payer: Self-pay | Admitting: Sports Medicine

## 2017-01-29 VITALS — BP 120/62 | Ht 65.0 in | Wt 130.0 lb

## 2017-01-29 DIAGNOSIS — Q667 Congenital pes cavus, unspecified foot: Secondary | ICD-10-CM

## 2017-01-30 NOTE — Progress Notes (Signed)
   Subjective:    Patient ID: Jane Mcdonald, female    DOB: July 05, 1955, 61 y.o.   MRN: [id number]  HPI chief complaint: Bilateral foot pain  Very pleasant 61 year old female with a history of bilateral plantar fasciitis comes in today for new custom orthotics. Her previous custom orthotics are 60 years old. She is starting to have some returning foot pain. No trauma. No swelling. She denies numbness or tingling around the foot or ankle. Pain is identical in nature to what she has had previously.  Interim medical history reviewed Medications reviewed Allergies reviewed    Review of Systems    as above Objective:   Physical Exam  Well-developed, well-nourished. No acute distress. Awake alert and oriented 3.  Examination of both feet show a cavus foot bilaterally. Slight tenderness to palpation at the calcaneal origin of plantar fascia. Negative calcaneal squeeze. No soft tissue swelling. Neurovascularly intact distally. Walking without a limp.       Assessment & Plan:   History of plantar fasciitis Cavus foot  New custom orthotics were created today. Patient found them to be very comfortable prior to leaving the office. Total of 30 minutes was spent with the patient with greater than 50% of the time spent in face-to-face consultation discussing orthotic construction, instruction, and fitting. Gait was neutralized with orthotics in place. Patient will follow-up as needed.  Patient was fitted for a : standard, cushioned, semi-rigid orthotic. The orthotic was heated and afterward the patient stood on the orthotic blank positioned on the orthotic stand. The patient was positioned in subtalar neutral position and 10 degrees of ankle dorsiflexion in a weight bearing stance. After completion of molding, a stable base was applied to the orthotic blank. The blank was ground to a stable position for weight bearing. Size: 8 Base: Blue EVA Posting: none Additional orthotic padding: small  MT pad on the right

## 2017-02-19 DIAGNOSIS — Z1231 Encounter for screening mammogram for malignant neoplasm of breast: Secondary | ICD-10-CM | POA: Diagnosis not present

## 2017-02-19 LAB — HM MAMMOGRAPHY

## 2017-03-06 ENCOUNTER — Other Ambulatory Visit (INDEPENDENT_AMBULATORY_CARE_PROVIDER_SITE_OTHER): Payer: Federal, State, Local not specified - PPO

## 2017-03-06 DIAGNOSIS — Z0000 Encounter for general adult medical examination without abnormal findings: Secondary | ICD-10-CM

## 2017-03-06 LAB — CBC WITH DIFFERENTIAL/PLATELET
Basophils Absolute: 0 10*3/uL (ref 0.0–0.1)
Basophils Relative: 0.7 % (ref 0.0–3.0)
EOS PCT: 3.8 % (ref 0.0–5.0)
Eosinophils Absolute: 0.2 10*3/uL (ref 0.0–0.7)
HCT: 39 % (ref 36.0–46.0)
Hemoglobin: 13 g/dL (ref 12.0–15.0)
LYMPHS ABS: 1.6 10*3/uL (ref 0.7–4.0)
Lymphocytes Relative: 26.6 % (ref 12.0–46.0)
MCHC: 33.3 g/dL (ref 30.0–36.0)
MCV: 91.6 fl (ref 78.0–100.0)
MONOS PCT: 8.4 % (ref 3.0–12.0)
Monocytes Absolute: 0.5 10*3/uL (ref 0.1–1.0)
NEUTROS ABS: 3.5 10*3/uL (ref 1.4–7.7)
NEUTROS PCT: 60.5 % (ref 43.0–77.0)
PLATELETS: 236 10*3/uL (ref 150.0–400.0)
RBC: 4.26 Mil/uL (ref 3.87–5.11)
RDW: 13.5 % (ref 11.5–15.5)
WBC: 5.9 10*3/uL (ref 4.0–10.5)

## 2017-03-06 LAB — URINALYSIS, ROUTINE W REFLEX MICROSCOPIC
Bilirubin Urine: NEGATIVE
Ketones, ur: NEGATIVE
Leukocytes, UA: NEGATIVE
Nitrite: NEGATIVE
SPECIFIC GRAVITY, URINE: 1.02 (ref 1.000–1.030)
Total Protein, Urine: NEGATIVE
Urine Glucose: NEGATIVE
Urobilinogen, UA: 0.2 (ref 0.0–1.0)
pH: 7 (ref 5.0–8.0)

## 2017-03-06 LAB — HEPATIC FUNCTION PANEL
ALK PHOS: 39 U/L (ref 39–117)
ALT: 11 U/L (ref 0–35)
AST: 16 U/L (ref 0–37)
Albumin: 4.3 g/dL (ref 3.5–5.2)
BILIRUBIN DIRECT: 0.1 mg/dL (ref 0.0–0.3)
TOTAL PROTEIN: 6.8 g/dL (ref 6.0–8.3)
Total Bilirubin: 0.6 mg/dL (ref 0.2–1.2)

## 2017-03-06 LAB — BASIC METABOLIC PANEL
BUN: 11 mg/dL (ref 6–23)
CALCIUM: 9.4 mg/dL (ref 8.4–10.5)
CO2: 29 meq/L (ref 19–32)
Chloride: 104 mEq/L (ref 96–112)
Creatinine, Ser: 0.66 mg/dL (ref 0.40–1.20)
GFR: 97.37 mL/min (ref >60.00)
Glucose, Bld: 89 mg/dL (ref 70–99)
POTASSIUM: 4.2 meq/L (ref 3.5–5.1)
SODIUM: 141 meq/L (ref 135–145)

## 2017-03-06 LAB — LIPID PANEL
Cholesterol: 138 mg/dL (ref 0–200)
HDL: 65.4 mg/dL (ref >39.00)
LDL Cholesterol: 56 mg/dL (ref 0–99)
NONHDL: 72.66
Total CHOL/HDL Ratio: 2
Triglycerides: 82 mg/dL (ref 0.0–149.0)
VLDL: 16.4 mg/dL (ref 0.0–40.0)

## 2017-03-06 LAB — TSH: TSH: 1.68 u[IU]/mL (ref 0.35–4.50)

## 2017-03-13 ENCOUNTER — Ambulatory Visit (INDEPENDENT_AMBULATORY_CARE_PROVIDER_SITE_OTHER): Payer: Federal, State, Local not specified - PPO | Admitting: Internal Medicine

## 2017-03-13 ENCOUNTER — Encounter: Payer: Self-pay | Admitting: Internal Medicine

## 2017-03-13 VITALS — BP 124/76 | HR 67 | Temp 98.5°F | Ht 65.0 in | Wt 133.0 lb

## 2017-03-13 DIAGNOSIS — Z114 Encounter for screening for human immunodeficiency virus [HIV]: Secondary | ICD-10-CM | POA: Diagnosis not present

## 2017-03-13 DIAGNOSIS — I10 Essential (primary) hypertension: Secondary | ICD-10-CM | POA: Diagnosis not present

## 2017-03-13 DIAGNOSIS — R21 Rash and other nonspecific skin eruption: Secondary | ICD-10-CM | POA: Insufficient documentation

## 2017-03-13 DIAGNOSIS — Z23 Encounter for immunization: Secondary | ICD-10-CM | POA: Diagnosis not present

## 2017-03-13 DIAGNOSIS — Z0000 Encounter for general adult medical examination without abnormal findings: Secondary | ICD-10-CM | POA: Diagnosis not present

## 2017-03-13 MED ORDER — LISINOPRIL 20 MG PO TABS: 20.0000 mg | ORAL_TABLET | Freq: Every day | ORAL | 3 refills | Status: DC

## 2017-03-13 NOTE — Patient Instructions (Addendum)
You had the flu shot today, and the Tetanus Tdap  Please continue all other medications as before, and refills have been done if requested.  Please have the pharmacy call with any other refills you may need.  Please continue your efforts at being more active, low cholesterol diet, and weight control.  You are otherwise up to date with prevention measures today.  Please keep your appointments with your specialists as you may have planned  Please return in 1 year for your yearly visit, or sooner if needed, with Lab testing done 3-5 days before

## 2017-03-13 NOTE — Progress Notes (Signed)
Subjective:    Patient ID: Jane Mcdonald, female    DOB: 1955-09-17, 61 y.o.   MRN: [id number]  HPI   Here for wellness and f/u;  Overall doing ok;  Pt denies Chest pain, worsening SOB, DOE, wheezing, orthopnea, PND, worsening LE edema, palpitations, dizziness or syncope.  Pt denies neurological change such as new headache, facial or extremity weakness.  Pt denies polydipsia, polyuria, or low sugar symptoms. Pt states overall good compliance with treatment and medications, good tolerability, and has been trying to follow appropriate diet.  Pt denies worsening depressive symptoms, suicidal ideation or panic. No fever, night sweats, wt loss, loss of appetite, or other constitutional symptoms.  Pt states good ability with ADL's, has low fall risk, home safety reviewed and adequate, no other significant changes in hearing or vision, and only occasionally active with exercise. No other interval hx.  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 ANXIETY, SITUATIONAL 07/14/2010  . FOOT PAIN, BILATERAL 03/30/2010  . Glaucoma    sees optho/Dr. Dione BoozeGroat every 6 months  . Glaucoma (increased eye pressure) 02/01/2012  . HYPERTENSION 07/14/2010  . Palpitations   . Renal artery stenosis, native, bilateral (HCC) 07/28/2010  . Rosacea   . SMOKER 07/14/2010   Past Surgical History:  Procedure Laterality Date  . DILATION AND CURETTAGE OF UTERUS     s/p  . OOPHORECTOMY     left only 2012, non malignant mass  . TUBAL LIGATION      reports that  has never smoked. she has never used smokeless tobacco. Her alcohol and drug histories are not on file. family history includes Cancer in her other; Heart disease in her father; Hypertension in her mother; Nephrolithiasis in her father. No Known Allergies Current Outpatient Medications on  File Prior to Visit  Medication Sig Dispense Refill  . aspirin 81 MG EC tablet Take 1 tablet (81 mg total) by mouth daily. Swallow whole. 30 tablet 12  . cyclobenzaprine (FLEXERIL) 5 MG tablet Take 1 tablet (5 mg total) by mouth 3 (three) times daily as needed for muscle spasms. 60 tablet 1  . latanoprost (XALATAN) 0.005 % ophthalmic solution 1 drop. Use as directed    . Multiple Minerals-Vitamins (CALCIUM & VIT D3 BONE HEALTH) LIQD Take by mouth.    . Turmeric Curcumin 500 MG CAPS Take by mouth.    . valACYclovir (VALTREX) 500 MG tablet Take 500 mg by mouth daily.       No current facility-administered medications on file prior to visit.    Review of Systems Constitutional: Negative for other unusual diaphoresis, sweats, appetite or weight changes HENT: Negative for other worsening hearing loss, ear pain, facial swelling, mouth sores or neck stiffness.   Eyes: Negative for other worsening pain, redness or other visual disturbance.  Respiratory: Negative for other stridor or swelling Cardiovascular: Negative for other palpitations or other chest pain  Gastrointestinal: Negative for worsening diarrhea or loose stools, blood in stool, distention or other pain Genitourinary: Negative for hematuria, flank pain or other change in urine volume.  Musculoskeletal: Negative for myalgias or other joint swelling.  Skin: Negative for other color change, or other wound or worsening drainage.  Neurological: Negative for other syncope or numbness. Hematological: Negative for other adenopathy or swelling Psychiatric/Behavioral:  Negative for hallucinations, other worsening agitation, SI, self-injury, or new decreased concentration All other system neg per pt    Objective:   Physical Exam BP 124/76   Pulse 67   Temp 98.5 F (36.9 C) (Oral)   Ht 5\' 5"  (1.651 m)   Wt 133 lb (60.3 kg)   SpO2 99%   BMI 22.13 kg/m  VS noted,  Constitutional: Pt is oriented to person, place, and time. Appears  well-developed and well-nourished, in no significant distress and comfortable Head: Normocephalic and atraumatic  Eyes: Conjunctivae and EOM are normal. Pupils are equal, round, and reactive to light Right Ear: External ear normal without discharge Left Ear: External ear normal without discharge Nose: Nose without discharge or deformity Mouth/Throat: Oropharynx is without other ulcerations and moist  Neck: Normal range of motion. Neck supple. No JVD present. No tracheal deviation present or significant neck LA or mass Cardiovascular: Normal rate, regular rhythm, normal heart sounds and intact distal pulses.   Pulmonary/Chest: WOB normal and breath sounds without rales or wheezing  Abdominal: Soft. Bowel sounds are normal. NT. No HSM  Musculoskeletal: Normal range of motion. Exhibits no edema Lymphadenopathy: Has no other cervical adenopathy.  Neurological: Pt is alert and oriented to person, place, and time. Pt has normal reflexes. No cranial nerve deficit. Motor grossly intact, Gait intact Skin: Skin is warm and dry. No rash noted or new ulcerations Psychiatric:  Has normal mood and affect. Behavior is normal without agitation No other exam findings    Assessment & Plan:

## 2017-03-14 ENCOUNTER — Encounter: Payer: Self-pay | Admitting: Internal Medicine

## 2017-03-14 NOTE — Assessment & Plan Note (Signed)
stable overall by history and exam, recent data reviewed with pt, and pt to continue medical treatment as before,  to f/u any worsening symptoms or concerns  

## 2017-03-14 NOTE — Assessment & Plan Note (Signed)

## 2017-04-17 DIAGNOSIS — K080 Exfoliation of teeth due to systemic causes: Secondary | ICD-10-CM | POA: Diagnosis not present

## 2017-06-01 ENCOUNTER — Ambulatory Visit: Payer: Federal, State, Local not specified - PPO | Admitting: Vascular Surgery

## 2017-06-01 ENCOUNTER — Encounter (HOSPITAL_COMMUNITY): Payer: Federal, State, Local not specified - PPO

## 2017-06-22 ENCOUNTER — Ambulatory Visit: Payer: Federal, State, Local not specified - PPO | Admitting: Vascular Surgery

## 2017-06-22 ENCOUNTER — Ambulatory Visit (HOSPITAL_COMMUNITY)
Admission: RE | Admit: 2017-06-22 | Discharge: 2017-06-22 | Disposition: A | Discharge status: Home / Self Care | Payer: Federal, State, Local not specified - PPO | Source: Ambulatory Visit | Attending: Vascular Surgery | Admitting: Vascular Surgery

## 2017-06-22 ENCOUNTER — Encounter (HOSPITAL_COMMUNITY): Payer: Federal, State, Local not specified - PPO

## 2017-06-22 ENCOUNTER — Encounter: Payer: Self-pay | Admitting: Vascular Surgery

## 2017-06-22 ENCOUNTER — Other Ambulatory Visit: Payer: Self-pay

## 2017-06-22 VITALS — BP 109/65 | HR 70 | Temp 97.4°F | Resp 14 | Ht 65.0 in | Wt 129.0 lb

## 2017-06-22 DIAGNOSIS — I773 Arterial fibromuscular dysplasia: Secondary | ICD-10-CM

## 2017-06-22 NOTE — Progress Notes (Signed)
Patient ID: Jane Mcdonald, female   DOB: [birth year], 62 y.o.   MRN: [id number]  Reason for Consult: Follow-up   Referred by Corwin Levins, MD  Subjective:     HPI:  Jane Mcdonald is a 62 y.o. female follows up with renal artery duplex for fibromuscular dysplasia which she has known about for some time.  She remains on one drug for hypertension.  She is stable kidney function according to her and was last checked in October per the records is 0.66.  She has not had any pulmonary edema.  She is never had a stroke TIA or amaurosis.  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  Past Medical History:  Diagnosis Date  . ANXIETY, SITUATIONAL 07/14/2010  . FOOT PAIN, BILATERAL 03/30/2010  . Glaucoma    sees optho/Dr. Dione Booze every 6 months  . Glaucoma (increased eye pressure) 02/01/2012  . HYPERTENSION 07/14/2010  . Palpitations   . Renal artery stenosis, native, bilateral (HCC) 07/28/2010  . Rosacea   . SMOKER 07/14/2010   Family History  Problem Relation Age of Onset  . Hypertension Mother   . Heart disease Father   . Nephrolithiasis Father   . Cancer Other        ENT Cancer   Past Surgical History:  Procedure Laterality Date  . DILATION AND CURETTAGE OF UTERUS     s/p  . OOPHORECTOMY     left only 2012, non malignant mass  . TUBAL LIGATION      Short Social History:  Social History   Tobacco Use  . Smoking status: Never Smoker  . Smokeless tobacco: Never Used  Substance Use Topics  . Alcohol use: Yes    Comment: some    No Known Allergies  Current Outpatient Medications  Medication Sig Dispense Refill  . aspirin 81 MG EC tablet Take 1 tablet (81 mg total) by mouth daily. Swallow whole. 30 tablet 12  . cyclobenzaprine (FLEXERIL) 5 MG tablet Take 1 tablet (5 mg total) by mouth 3 (three) times daily as needed for muscle spasms. 60 tablet 1  . latanoprost (XALATAN)  0.005 % ophthalmic solution 1 drop. Use as directed    . lisinopril (PRINIVIL,ZESTRIL) 20 MG tablet Take 1 tablet (20 mg total) daily by mouth. 90 tablet 3  . Multiple Minerals-Vitamins (CALCIUM & VIT D3 BONE HEALTH) LIQD Take by mouth.    . Turmeric Curcumin 500 MG CAPS Take by mouth.    . valACYclovir (VALTREX) 500 MG tablet Take 500 mg by mouth daily.       No current facility-administered medications for this visit.     Review of Systems  Constitutional:  Constitutional negative. HENT: HENT negative.  Eyes: Eyes negative.  Cardiovascular: Cardiovascular negative.  GI: Gastrointestinal negative.  Musculoskeletal: Musculoskeletal negative.  Skin:       Varicose veins left leg Neurological: Neurological negative. Hematologic: Hematologic/lymphatic negative.  Psychiatric: Psychiatric negative.        Objective:  Objective   Vitals:   06/22/17 1103  BP: 109/65  Pulse: 70  Resp: 14  Temp: (!) 97.4 F (36.3 C)  TempSrc: Oral  SpO2: 100%  Weight: 129 lb (58.5 kg)  Height: 5\' 5"  (1.651 m)   Body mass index is 21.47 kg/m.  Physical Exam  Constitutional: She is oriented to person, place, and time. She appears well-developed.  HENT:  Head: Normocephalic.  Eyes:  Pupils are equal, round, and reactive to light.  Neck: Normal range of motion. Neck supple.  Cardiovascular: Normal rate.  Pulses:      Carotid pulses are 2+ on the right side, and 2+ on the left side.      Radial pulses are 2+ on the right side, and 2+ on the left side.       Popliteal pulses are 2+ on the right side, and 2+ on the left side.       Dorsalis pedis pulses are 2+ on the right side, and 2+ on the left side.       Posterior tibial pulses are 2+ on the right side, and 2+ on the left side.  Abdominal: Soft. She exhibits no mass.  Musculoskeletal: She exhibits no edema.  Left medial leg large varicosities with telangiectasia at the ankle  Neurological: She is alert and oriented to person, place, and  time.  Skin: Skin is dry.  Psychiatric: She has a normal mood and affect. Her behavior is normal. Judgment and thought content normal.    Data: I have independently interpreted her renal artery duplex today which there is stable greater than 60% stenosis on the right.     Assessment/Plan:     62 year old female follows up with fibromuscular dysplasia with evidence of right renal artery stenosis.  She remains on 1 drug antihypertensive regimen has not had worsening renal function.  From the standpoint is unlikely she will need intervention but will continue to follow her every 2 years.  We will also get a carotid duplex in 2 years that has been several years since that evaluation although she remains asymptomatic from that standpoint as well.  She still does have varicosities on the left lower extremity and wears compression stockings when she is working which she should continue to do.  She should over 1 intervention here we can perform reflux testing for her but currently she is uninterested.  She will follow-up in 2 years unless there are issues prior.     Maeola HarmanBrandon Christopher Jalon Blackwelder MD [facility]

## 2017-09-05 DIAGNOSIS — H2513 Age-related nuclear cataract, bilateral: Secondary | ICD-10-CM | POA: Diagnosis not present

## 2017-09-05 DIAGNOSIS — H40053 Ocular hypertension, bilateral: Secondary | ICD-10-CM | POA: Diagnosis not present

## 2017-10-09 ENCOUNTER — Encounter: Payer: Self-pay | Admitting: Internal Medicine

## 2017-10-09 DIAGNOSIS — Z85828 Personal history of other malignant neoplasm of skin: Secondary | ICD-10-CM | POA: Diagnosis not present

## 2017-10-09 DIAGNOSIS — L570 Actinic keratosis: Secondary | ICD-10-CM | POA: Diagnosis not present

## 2017-10-09 DIAGNOSIS — D225 Melanocytic nevi of trunk: Secondary | ICD-10-CM | POA: Diagnosis not present

## 2017-10-09 DIAGNOSIS — B009 Herpesviral infection, unspecified: Secondary | ICD-10-CM | POA: Diagnosis not present

## 2017-10-09 DIAGNOSIS — C44619 Basal cell carcinoma of skin of left upper limb, including shoulder: Secondary | ICD-10-CM | POA: Diagnosis not present

## 2017-10-09 DIAGNOSIS — Z01419 Encounter for gynecological examination (general) (routine) without abnormal findings: Secondary | ICD-10-CM | POA: Diagnosis not present

## 2017-10-09 DIAGNOSIS — Z780 Asymptomatic menopausal state: Secondary | ICD-10-CM | POA: Diagnosis not present

## 2017-10-09 DIAGNOSIS — Z79899 Other long term (current) drug therapy: Secondary | ICD-10-CM | POA: Diagnosis not present

## 2017-10-09 DIAGNOSIS — L821 Other seborrheic keratosis: Secondary | ICD-10-CM | POA: Diagnosis not present

## 2017-10-22 DIAGNOSIS — K080 Exfoliation of teeth due to systemic causes: Secondary | ICD-10-CM | POA: Diagnosis not present

## 2018-03-13 ENCOUNTER — Other Ambulatory Visit (INDEPENDENT_AMBULATORY_CARE_PROVIDER_SITE_OTHER): Payer: Federal, State, Local not specified - PPO

## 2018-03-13 ENCOUNTER — Telehealth: Payer: Self-pay | Admitting: Internal Medicine

## 2018-03-13 DIAGNOSIS — Z1231 Encounter for screening mammogram for malignant neoplasm of breast: Secondary | ICD-10-CM | POA: Diagnosis not present

## 2018-03-13 DIAGNOSIS — Z0000 Encounter for general adult medical examination without abnormal findings: Secondary | ICD-10-CM | POA: Diagnosis not present

## 2018-03-13 DIAGNOSIS — Z114 Encounter for screening for human immunodeficiency virus [HIV]: Secondary | ICD-10-CM | POA: Diagnosis not present

## 2018-03-13 LAB — BASIC METABOLIC PANEL
BUN: 15 mg/dL (ref 6–23)
CALCIUM: 9.7 mg/dL (ref 8.4–10.5)
CO2: 30 mEq/L (ref 19–32)
Chloride: 103 mEq/L (ref 96–112)
Creatinine, Ser: 0.77 mg/dL (ref 0.40–1.20)
GFR: 81.22 mL/min (ref >60.00)
Glucose, Bld: 101 mg/dL — ABNORMAL HIGH (ref 70–99)
POTASSIUM: 5.4 meq/L — AB (ref 3.5–5.1)
Sodium: 140 mEq/L (ref 135–145)

## 2018-03-13 LAB — URINALYSIS, ROUTINE W REFLEX MICROSCOPIC
Bilirubin Urine: NEGATIVE
KETONES UR: NEGATIVE
Nitrite: NEGATIVE
PH: 7 (ref 5.0–8.0)
Specific Gravity, Urine: 1.015 (ref 1.000–1.030)
Total Protein, Urine: NEGATIVE
UROBILINOGEN UA: 0.2 (ref 0.0–1.0)
Urine Glucose: NEGATIVE

## 2018-03-13 LAB — CBC WITH DIFFERENTIAL/PLATELET
BASOS PCT: 0.9 % (ref 0.0–3.0)
Basophils Absolute: 0.1 10*3/uL (ref 0.0–0.1)
EOS PCT: 2.7 % (ref 0.0–5.0)
Eosinophils Absolute: 0.2 10*3/uL (ref 0.0–0.7)
HCT: 39.2 % (ref 36.0–46.0)
HEMOGLOBIN: 13.1 g/dL (ref 12.0–15.0)
Lymphocytes Relative: 24.6 % (ref 12.0–46.0)
Lymphs Abs: 1.5 10*3/uL (ref 0.7–4.0)
MCHC: 33.6 g/dL (ref 30.0–36.0)
MCV: 89.2 fl (ref 78.0–100.0)
MONO ABS: 0.6 10*3/uL (ref 0.1–1.0)
Monocytes Relative: 9.3 % (ref 3.0–12.0)
Neutro Abs: 3.9 10*3/uL (ref 1.4–7.7)
Neutrophils Relative %: 62.5 % (ref 43.0–77.0)
Platelets: 258 10*3/uL (ref 150.0–400.0)
RBC: 4.39 Mil/uL (ref 3.87–5.11)
RDW: 13 % (ref 11.5–15.5)
WBC: 6.3 10*3/uL (ref 4.0–10.5)

## 2018-03-13 LAB — HEPATIC FUNCTION PANEL
ALBUMIN: 4.6 g/dL (ref 3.5–5.2)
ALT: 14 U/L (ref 0–35)
AST: 16 U/L (ref 0–37)
Alkaline Phosphatase: 38 U/L — ABNORMAL LOW (ref 39–117)
BILIRUBIN TOTAL: 0.7 mg/dL (ref 0.2–1.2)
Bilirubin, Direct: 0.1 mg/dL (ref 0.0–0.3)
TOTAL PROTEIN: 7.1 g/dL (ref 6.0–8.3)

## 2018-03-13 LAB — LIPID PANEL
CHOLESTEROL: 149 mg/dL (ref 0–200)
HDL: 66.7 mg/dL (ref >39.00)
LDL Cholesterol: 62 mg/dL (ref 0–99)
NonHDL: 81.92
Total CHOL/HDL Ratio: 2
Triglycerides: 102 mg/dL (ref 0.0–149.0)
VLDL: 20.4 mg/dL (ref 0.0–40.0)

## 2018-03-13 LAB — TSH: TSH: 1.34 u[IU]/mL (ref 0.35–4.50)

## 2018-03-13 LAB — HM MAMMOGRAPHY

## 2018-03-13 NOTE — Telephone Encounter (Signed)
Patient has dropped off FMLA ( She has an appointment on 11/12 for her CPE ) She states she needs this because her job lets her take 10 days a year to help with a family member. Her Mother is not a patient of ours. I informed her this needs to be taken to her mother's doctor (She isn't sure of his name) But that she tried that and they will not complete the forms because her mother does not have her daughter on her DPR, nor will see put her on there.   I informed her she has to have her mother's doctor complete this because we do not have her mother's medical records or know anything about her health. She states why can't we just put she is elder on the forms.  Please advise if you are okay with any of this?

## 2018-03-13 NOTE — Telephone Encounter (Signed)
Sorry, I would not feel comfortable since her mother is not a patient of ours here, and legally this would not be proper due to this reason.  I dont have any other way to help except to suggest she take her mother to a new PCP

## 2018-03-13 NOTE — Telephone Encounter (Signed)
I keep calling patient but its not ringing and no vm is picking up. Please inform patient of message below. Otherwise we inform her at there appointment and she can pick up the forms.

## 2018-03-14 LAB — HIV ANTIBODY (ROUTINE TESTING W REFLEX): HIV 1&2 Ab, 4th Generation: NONREACTIVE

## 2018-03-19 ENCOUNTER — Encounter: Payer: Self-pay | Admitting: Internal Medicine

## 2018-03-19 ENCOUNTER — Ambulatory Visit (INDEPENDENT_AMBULATORY_CARE_PROVIDER_SITE_OTHER): Payer: Federal, State, Local not specified - PPO | Admitting: Internal Medicine

## 2018-03-19 VITALS — BP 110/70 | HR 86 | Temp 98.4°F | Ht 65.0 in | Wt 133.0 lb

## 2018-03-19 DIAGNOSIS — I10 Essential (primary) hypertension: Secondary | ICD-10-CM

## 2018-03-19 DIAGNOSIS — J309 Allergic rhinitis, unspecified: Secondary | ICD-10-CM | POA: Diagnosis not present

## 2018-03-19 DIAGNOSIS — M7521 Bicipital tendinitis, right shoulder: Secondary | ICD-10-CM

## 2018-03-19 DIAGNOSIS — Z0001 Encounter for general adult medical examination with abnormal findings: Secondary | ICD-10-CM | POA: Diagnosis not present

## 2018-03-19 DIAGNOSIS — Z0000 Encounter for general adult medical examination without abnormal findings: Secondary | ICD-10-CM

## 2018-03-19 MED ORDER — DICLOFENAC SODIUM 1 % TD GEL: 2.0000 g | Freq: Four times a day (QID) | TRANSDERMAL | 5 refills | Status: DC

## 2018-03-19 MED ORDER — DICLOFENAC SODIUM 1 % TD GEL: 2.0000 g | Freq: Four times a day (QID) | TRANSDERMAL | 5 refills | Status: AC

## 2018-03-19 MED ORDER — TRIAMCINOLONE ACETONIDE 55 MCG/ACT NA AERO: 2.0000 | INHALATION_SPRAY | Freq: Every day | NASAL | 12 refills | Status: AC

## 2018-03-19 MED ORDER — LISINOPRIL 20 MG PO TABS: 20.0000 mg | ORAL_TABLET | Freq: Every day | ORAL | 3 refills | Status: DC

## 2018-03-19 NOTE — Assessment & Plan Note (Addendum)
Mild to mod, for sport med referral in this office for probable cortisone,  to f/u any worsening symptoms or concerns  In addition to the time spent performing CPE, I spent an additional 15 minutes face to face,in which greater than 50% of this time was spent in counseling and coordination of care for patient's illness as documented, including the differential dx, treatment, further evaluation and other management of shoulder tendonitis, allergic rhinitis, and HTN

## 2018-03-19 NOTE — Assessment & Plan Note (Signed)

## 2018-03-19 NOTE — Patient Instructions (Signed)
Please take all new medication as prescribed - the voltaren gel for the pain, and nasacort for allergies  Please see Dr Katrinka Blazing for the right shoulder if gets worse  Please continue all other medications as before, and refills have been done if requested.  Please have the pharmacy call with any other refills you may need.  Please continue your efforts at being more active, low cholesterol diet, and weight control.  You are otherwise up to date with prevention measures today.  Please keep your appointments with your specialists as you may have planned  Please return in 1 year for your yearly visit, or sooner if needed, with Lab testing done 3-5 days before

## 2018-03-19 NOTE — Progress Notes (Signed)
Subjective:    Patient ID: Jane Mcdonald, female    DOB: 08-05-1955, 62 y.o.   MRN: [id number]  HPI  Here for wellness and f/u;  Overall doing ok;  Pt denies Chest pain, worsening SOB, DOE, wheezing, orthopnea, PND, worsening LE edema, palpitations, dizziness or syncope.  Pt denies neurological change such as new headache, facial or extremity weakness.  Pt denies polydipsia, polyuria, or low sugar symptoms. Pt states overall good compliance with treatment and medications, good tolerability, and has been trying to follow appropriate diet.  Pt denies worsening depressive symptoms, suicidal ideation or panic. No fever, night sweats, wt loss, loss of appetite, or other constitutional symptoms.  Pt states good ability with ADL's, has low fall risk, home safety reviewed and adequate, no other significant changes in hearing or vision, and only occasionally active with exercise.  Due for f/u mammogram f/u later this week.   Also has subacute onset 2 wks worsening left shoulder pain without swelling or rash or neck pain or other arm pain; mild to mod, intermittent, worse to forward elevated, not better with anything,. Does have several wks ongoing nasal allergy symptoms with clearish congestion, itch and sneezing, without fever, pain, ST, cough, swelling or wheezing. Past Medical History:  Diagnosis Date  . ANXIETY, SITUATIONAL 07/14/2010  . FOOT PAIN, BILATERAL 03/30/2010  . Glaucoma    sees optho/Dr. Dione Booze every 6 months  . Glaucoma (increased eye pressure) 02/01/2012  . HYPERTENSION 07/14/2010  . Palpitations   . Renal artery stenosis, native, bilateral (HCC) 07/28/2010  . Rosacea   . SMOKER 07/14/2010   Past Surgical History:  Procedure Laterality Date  . DILATION AND CURETTAGE OF UTERUS     s/p  . OOPHORECTOMY     left only 2012, non malignant mass  . TUBAL LIGATION      reports that she has never smoked. She has never used smokeless tobacco. She reports that she drinks alcohol. She reports that  she does not use drugs. family history includes Cancer in her other; Heart disease in her father; Hypertension in her mother; Nephrolithiasis in her father. No Known Allergies Current Outpatient Medications on File Prior to Visit  Medication Sig Dispense Refill  . aspirin 81 MG EC tablet Take 1 tablet (81 mg total) by mouth daily. Swallow whole. 30 tablet 12  . cyclobenzaprine (FLEXERIL) 5 MG tablet Take 1 tablet (5 mg total) by mouth 3 (three) times daily as needed for muscle spasms. 60 tablet 1  . latanoprost (XALATAN) 0.005 % ophthalmic solution 1 drop. Use as directed    . Multiple Minerals-Vitamins (CALCIUM & VIT D3 BONE HEALTH) LIQD Take by mouth.    . Turmeric Curcumin 500 MG CAPS Take by mouth.    . valACYclovir (VALTREX) 500 MG tablet Take 500 mg by mouth daily.       No current facility-administered medications on file prior to visit.    Review of Systems Constitutional: Negative for other unusual diaphoresis, sweats, appetite or weight changes HENT: Negative for other worsening hearing loss, ear pain, facial swelling, mouth sores or neck stiffness.   Eyes: Negative for other worsening pain, redness or other visual disturbance.  Respiratory: Negative for other stridor or swelling Cardiovascular: Negative for other palpitations or other chest pain  Gastrointestinal: Negative for worsening diarrhea or loose stools, blood in stool, distention or other pain Genitourinary: Negative for hematuria, flank pain or other change in urine volume.  Musculoskeletal: Negative for myalgias or other joint swelling.  Skin: Negative  for other color change, or other wound or worsening drainage.  Neurological: Negative for other syncope or numbness. Hematological: Negative for other adenopathy or swelling Psychiatric/Behavioral: Negative for hallucinations, other worsening agitation, SI, self-injury, or new decreased concentration All other system neg per pt    Objective:   Physical Exam BP  110/70   Pulse 86   Temp 98.4 F (36.9 C) (Oral)   Ht 5\' 5"  (1.651 m)   Wt 133 lb (60.3 kg)   SpO2 98%   BMI 22.13 kg/m  VS noted,  Constitutional: Pt is oriented to person, place, and time. Appears well-developed and well-nourished, in no significant distress and comfortable Head: Normocephalic and atraumatic  Eyes: Conjunctivae and EOM are normal. Pupils are equal, round, and reactive to light Bilat tm's with mild erythema.  Max sinus areas non tender.  Pharynx with mild erythema, no exudate Right Ear: External ear normal without discharge Left Ear: External ear normal without discharge Nose: Nose without discharge or deformity Mouth/Throat: Oropharynx is without other ulcerations and moist  Neck: Normal range of motion. Neck supple. No JVD present. No tracheal deviation present or significant neck LA or mass Cardiovascular: Normal rate, regular rhythm, normal heart sounds and intact distal pulses.   Pulmonary/Chest: WOB normal and breath sounds without rales or wheezing  Abdominal: Soft. Bowel sounds are normal. NT. No HSM  Musculoskeletal: Normal range of motion. Exhibits no edema, tender to left bicipital tendon insertion site Lymphadenopathy: Has no other cervical adenopathy.  Neurological: Pt is alert and oriented to person, place, and time. Pt has normal reflexes. No cranial nerve deficit. Motor grossly intact, Gait intact Skin: Skin is warm and dry. No rash noted or new ulcerations Psychiatric:  Has normal mood and affect. Behavior is normal without agitation No other exam findings Lab Results  Component Value Date   WBC 6.3 03/13/2018   HGB 13.1 03/13/2018   HCT 39.2 03/13/2018   PLT 258.0 03/13/2018   GLUCOSE 101 (H) 03/13/2018   CHOL 149 03/13/2018   TRIG 102.0 03/13/2018   HDL 66.70 03/13/2018   LDLCALC 62 03/13/2018   ALT 14 03/13/2018   AST 16 03/13/2018   NA 140 03/13/2018   K 5.4 (H) 03/13/2018   CL 103 03/13/2018   CREATININE 0.77 03/13/2018   BUN 15  03/13/2018   CO2 30 03/13/2018   TSH 1.34 03/13/2018   HGBA1C 5.5 03/03/2016   MICROALBUR <0.7 03/03/2016       Assessment & Plan:

## 2018-03-19 NOTE — Assessment & Plan Note (Signed)
stable overall by history and exam, recent data reviewed with pt, and pt to continue medical treatment as before,  to f/u any worsening symptoms or concerns  

## 2018-03-19 NOTE — Assessment & Plan Note (Signed)
Mild to mod, for nasacort asd,  to f/u any worsening symptoms or concerns 

## 2018-03-25 DIAGNOSIS — R921 Mammographic calcification found on diagnostic imaging of breast: Secondary | ICD-10-CM | POA: Diagnosis not present

## 2018-03-25 LAB — HM MAMMOGRAPHY

## 2018-04-15 NOTE — Progress Notes (Signed)
[facility] [address] [place], [place] [address] Phone: [phone] Subjective:   Bruce Donath, am serving as a scribe for Dr. Antoine Primas.  I'm seeing this patient by the request  of:  Corwin Levins, MD   CC: Right shoulder pain  [id number]  Jane Mcdonald is a 62 y.o. female coming in with complaint of right shoulder and elbow pain. 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 Pain over superior aspect of shoulder. Pain is intermittent and achy. She has been using horse liniment. Pushing motions bother her the most.   Pain in elbow is over olecranon. Has gotten worse recently. Does wear wrist brace with work and sleeping. Pain is dull but can be sharp. Starting to become constant pain.      Past Medical History:  Diagnosis Date  . ANXIETY, SITUATIONAL 07/14/2010  . FOOT PAIN, BILATERAL 03/30/2010  . Glaucoma    sees optho/Dr. Dione Booze every 6 months  . Glaucoma (increased eye pressure) 02/01/2012  . HYPERTENSION 07/14/2010  . Palpitations   . Renal artery stenosis, native, bilateral (HCC) 07/28/2010  . Rosacea   . SMOKER 07/14/2010   Past Surgical History:  Procedure Laterality Date  . DILATION AND CURETTAGE OF UTERUS     s/p  . OOPHORECTOMY     left only 2012, non malignant mass  . TUBAL LIGATION     Social History   Socioeconomic History  . Marital status: Single    Spouse name: Not on file  . Number of children: Not on file  . Years of education: Not on file  . Highest education level: Not on file  Occupational History  . Not on file  Social Needs  . Financial resource strain: Not on file  . Food insecurity:    Worry: Not on file    Inability: Not on file  . Transportation needs:    Medical: Not on file    Non-medical: Not on file  Tobacco Use  . Smoking status: Never Smoker  . Smokeless tobacco:  Never Used  Substance and Sexual Activity  . Alcohol use: Yes    Comment: some  . Drug use: No  . Sexual activity: Not on file  Lifestyle  . Physical activity:    Days per week: Not on file    Minutes per session: Not on file  . Stress: Not on file  Relationships  . Social connections:    Talks on phone: Not on file    Gets together: Not on file    Attends religious service: Not on file    Active member of club or organization: Not on file    Attends meetings of clubs or organizations: Not on file    Relationship status: Not on file  Other Topics Concern  . Not on file  Social History Narrative  . Not on file   No Known Allergies Family History  Problem Relation Age of Onset  . Hypertension Mother   . Heart disease Father   . Nephrolithiasis Father   . Cancer Other        ENT Cancer     Current Outpatient Medications (Cardiovascular):  .  lisinopril (PRINIVIL,ZESTRIL) 20 MG tablet, Take 1 tablet (20 mg total) by mouth daily.  Current Outpatient Medications (Respiratory):  .  triamcinolone (NASACORT) 55  MCG/ACT AERO nasal inhaler, Place 2 sprays into the nose daily.  Current Outpatient Medications (Analgesics):  .  aspirin 81 MG EC tablet, Take 1 tablet (81 mg total) by mouth daily. Swallow whole.   Current Outpatient Medications (Other):  .  cyclobenzaprine (FLEXERIL) 5 MG tablet, Take 1 tablet (5 mg total) by mouth 3 (three) times daily as needed for muscle spasms. .  diclofenac sodium (VOLTAREN) 1 % GEL, Apply 2 g topically 4 (four) times daily. Marland Kitchen  latanoprost (XALATAN) 0.005 % ophthalmic solution, 1 drop. Use as directed .  Multiple Minerals-Vitamins (CALCIUM & VIT D3 BONE HEALTH) LIQD, Take by mouth. .  Turmeric Curcumin 500 MG CAPS, Take by mouth. .  valACYclovir (VALTREX) 500 MG tablet, Take 500 mg by mouth daily.   .  Vitamin D, Ergocalciferol, (DRISDOL) 1.25 MG (50000 UT) CAPS capsule, Take 1 capsule (50,000 Units total) by mouth every 7 (seven)  days.    Past medical history, social, surgical and family history all reviewed in electronic medical record.  No pertanent information unless stated regarding to the chief complaint.   Review of Systems:  No headache, visual changes, nausea, vomiting, diarrhea, constipation, dizziness, abdominal pain, skin rash, fevers, chills, night sweats, weight loss, swollen lymph nodes, body aches, joint swelling, muscle aches, chest pain, shortness of breath, mood changes.   Objective  Blood pressure 130/82, pulse 81, height 5\' 5"  (1.651 m), weight 132 lb (59.9 kg), SpO2 99 %.   General: No apparent distress alert and oriented x3 mood and affect normal, dressed appropriately.  HEENT: Pupils equal, extraocular movements intact  Respiratory: Patient's speak in full sentences and does not appear short of breath  Cardiovascular: No lower extremity edema, non tender, no erythema  Skin: Warm dry intact with no signs of infection or rash on extremities or on axial skeleton.  Abdomen: Soft nontender  Neuro: Cranial nerves II through XII are intact, neurovascularly intact in all extremities with 2+ DTRs and 2+ pulses.  Lymph: No lymphadenopathy of posterior or anterior cervical chain or axillae bilaterally.  Gait normal with good balance and coordination.  MSK:  Non tender with full range of motion and good stability and symmetric strength and tone of   wrist, hip, knee and ankles bilaterally.  Right elbow exam has full range of motion.  Mild tenderness with resisted extension of the wrist.  Mild tenderness over the lateral upper leg Shoulder: Right Inspection reveals no abnormalities, atrophy or asymmetry. Palpation is normal with no tenderness over AC joint or bicipital groove. ROM is full in all planes passively. Rotator cuff strength normal throughout. signs of impingement with positive Neer and Hawkin's tests, but negative empty can sign. Speeds and Yergason's tests normal. No labral pathology noted  with negative Obrien's, negative clunk and good stability. Normal scapular function observed. No painful arc and no drop arm sign. No apprehension sign  MSK US performed of: Right This study was ordered, performed, and interpreted by Terrilee Files D.O.  Shoulder:   Supraspinatus:  Appears normal on long and transverse views, Bursal bulge seen with shoulder abduction on impingement view. Infraspinatus:  Appears normal on long and transverse views. Significant increase in Doppler flow Subscapularis:  Appears normal on long and transverse views. Positive bursa Teres Minor:  Appears normal on long and transverse views. AC joint:  Capsule undistended, no geyser sign. Glenohumeral Joint:  Appears normal without effusion. Glenoid Labrum:  Intact without visualized tears. Biceps Tendon:  Appears normal on long and transverse views, no  fraying of tendon, tendon located in intertubercular groove, no subluxation with shoulder internal or external rotation.  Impression: Subacromial bursitis, calcific  97110; 15 additional minutes spent for Therapeutic exercises as stated in above notes.  This included exercises focusing on stretching, strengthening, with significant focus on eccentric aspects.   Long term goals include an improvement in range of motion, strength, endurance as well as avoiding reinjury. Patient's frequency would include in 1-2 times a day, 3-5 times a week for a duration of 6-12 weeks. Shoulder Exercises that included:  Basic scapular stabilization to include adduction and depression of scapula Scaption, focusing on proper movement and good control Internal and External rotation utilizing a theraband, with elbow tucked at side entire time Rows with theraband which was given   Proper technique shown and discussed handout in great detail with ATC.  All questions were discussed and answered.      Impression and Recommendations:     This case required medical decision making of moderate  complexity. The above documentation has been reviewed and is accurate and complete Jane SaaZachary M Tekoa Amon, DO       Note: This dictation was prepared with Dragon dictation along with smaller phrase technology. Any transcriptional errors that result from this process are unintentional.

## 2018-04-16 ENCOUNTER — Encounter: Payer: Self-pay | Admitting: Family Medicine

## 2018-04-16 ENCOUNTER — Ambulatory Visit: Payer: Self-pay

## 2018-04-16 ENCOUNTER — Ambulatory Visit: Payer: Federal, State, Local not specified - PPO | Admitting: Family Medicine

## 2018-04-16 VITALS — BP 130/82 | HR 81 | Ht 65.0 in | Wt 132.0 lb

## 2018-04-16 DIAGNOSIS — M7711 Lateral epicondylitis, right elbow: Secondary | ICD-10-CM | POA: Diagnosis not present

## 2018-04-16 DIAGNOSIS — G8929 Other chronic pain: Secondary | ICD-10-CM

## 2018-04-16 DIAGNOSIS — M7551 Bursitis of right shoulder: Secondary | ICD-10-CM

## 2018-04-16 DIAGNOSIS — M25511 Pain in right shoulder: Secondary | ICD-10-CM

## 2018-04-16 MED ORDER — VITAMIN D (ERGOCALCIFEROL) 1.25 MG (50000 UNIT) PO CAPS: 50000.0000 [IU] | ORAL_CAPSULE | ORAL | 0 refills | Status: DC

## 2018-04-16 NOTE — Assessment & Plan Note (Signed)
Lateral Epicondylitis: Elbow anatomy was reviewed, and tendinopathy was explained.  Pt. given a formal rehab program. Series of concentric and eccentric exercises should be done starting with no weight, work up to 1 lb, hammer, etc.  Use counterforce strap if working or using hands.  Formal PT would be beneficial. Emphasized stretching an cross-friction massage Emphasized proper palms up lifting biomechanics to unload ECRB RTC in 4 weeks  

## 2018-04-16 NOTE — Assessment & Plan Note (Signed)
Mildly bursitis.  Discussed with patient patient is elected to try conservative therapy with her not having significant amount of pain today.  Discussed proper ergonomics and lifting mechanics.  Discussed topical anti-inflammatories and icing regimen.  Due to the calcific changes we will start her on once weekly vitamin D.  Follow-up again in 4 to 6 weeks.

## 2018-04-16 NOTE — Patient Instructions (Addendum)
Good to see you  Ice 20 minutes 2 times daily. Usually after activity and before bed. pennsaid pinkie amount topically 2 times daily as needed.  Try to avoid overhand lifting.  Try brace at work for next 2 weeks  Once weekly vitamin D for 12 weeks  Exercises 3 times a week.  For shoulder also keep hands within peripherla vision  See me again in 4-6 weeks

## 2018-04-23 DIAGNOSIS — K080 Exfoliation of teeth due to systemic causes: Secondary | ICD-10-CM | POA: Diagnosis not present

## 2018-05-28 ENCOUNTER — Ambulatory Visit: Payer: Federal, State, Local not specified - PPO | Admitting: Family Medicine

## 2018-07-09 DIAGNOSIS — L570 Actinic keratosis: Secondary | ICD-10-CM | POA: Diagnosis not present

## 2018-07-09 DIAGNOSIS — L821 Other seborrheic keratosis: Secondary | ICD-10-CM | POA: Diagnosis not present

## 2018-07-09 DIAGNOSIS — D225 Melanocytic nevi of trunk: Secondary | ICD-10-CM | POA: Diagnosis not present

## 2018-07-09 DIAGNOSIS — Z85828 Personal history of other malignant neoplasm of skin: Secondary | ICD-10-CM | POA: Diagnosis not present

## 2018-07-09 DIAGNOSIS — L820 Inflamed seborrheic keratosis: Secondary | ICD-10-CM | POA: Diagnosis not present

## 2018-12-23 DIAGNOSIS — H2513 Age-related nuclear cataract, bilateral: Secondary | ICD-10-CM | POA: Diagnosis not present

## 2018-12-23 DIAGNOSIS — H40053 Ocular hypertension, bilateral: Secondary | ICD-10-CM | POA: Diagnosis not present

## 2019-03-19 ENCOUNTER — Encounter: Payer: Self-pay | Admitting: Internal Medicine

## 2019-03-19 ENCOUNTER — Other Ambulatory Visit (INDEPENDENT_AMBULATORY_CARE_PROVIDER_SITE_OTHER): Payer: Federal, State, Local not specified - PPO

## 2019-03-19 DIAGNOSIS — Z0000 Encounter for general adult medical examination without abnormal findings: Secondary | ICD-10-CM

## 2019-03-19 LAB — URINALYSIS, ROUTINE W REFLEX MICROSCOPIC
Bilirubin Urine: NEGATIVE
Ketones, ur: NEGATIVE
Nitrite: NEGATIVE
Specific Gravity, Urine: 1.025 (ref 1.000–1.030)
Total Protein, Urine: NEGATIVE
Urine Glucose: NEGATIVE
Urobilinogen, UA: 0.2 (ref 0.0–1.0)
pH: 6 (ref 5.0–8.0)

## 2019-03-19 LAB — BASIC METABOLIC PANEL
BUN: 17 mg/dL (ref 6–23)
CO2: 30 mEq/L (ref 19–32)
Calcium: 9.2 mg/dL (ref 8.4–10.5)
Chloride: 102 mEq/L (ref 96–112)
Creatinine, Ser: 0.76 mg/dL (ref 0.40–1.20)
GFR: 77.31 mL/min (ref >60.00)
Glucose, Bld: 97 mg/dL (ref 70–99)
Potassium: 3.9 mEq/L (ref 3.5–5.1)
Sodium: 139 mEq/L (ref 135–145)

## 2019-03-19 LAB — HEPATIC FUNCTION PANEL
ALT: 14 U/L (ref 0–35)
AST: 18 U/L (ref 0–37)
Albumin: 4.6 g/dL (ref 3.5–5.2)
Alkaline Phosphatase: 41 U/L (ref 39–117)
Bilirubin, Direct: 0.1 mg/dL (ref 0.0–0.3)
Total Bilirubin: 0.5 mg/dL (ref 0.2–1.2)
Total Protein: 7.1 g/dL (ref 6.0–8.3)

## 2019-03-19 LAB — CBC WITH DIFFERENTIAL/PLATELET
Basophils Absolute: 0 10*3/uL (ref 0.0–0.1)
Basophils Relative: 0.5 % (ref 0.0–3.0)
Eosinophils Absolute: 0.1 10*3/uL (ref 0.0–0.7)
Eosinophils Relative: 1.5 % (ref 0.0–5.0)
HCT: 36.7 % (ref 36.0–46.0)
Hemoglobin: 12.4 g/dL (ref 12.0–15.0)
Lymphocytes Relative: 14.2 % (ref 12.0–46.0)
Lymphs Abs: 1.3 10*3/uL (ref 0.7–4.0)
MCHC: 33.8 g/dL (ref 30.0–36.0)
MCV: 88.6 fl (ref 78.0–100.0)
Monocytes Absolute: 0.6 10*3/uL (ref 0.1–1.0)
Monocytes Relative: 6.4 % (ref 3.0–12.0)
Neutro Abs: 7.4 10*3/uL (ref 1.4–7.7)
Neutrophils Relative %: 77.4 % — ABNORMAL HIGH (ref 43.0–77.0)
Platelets: 240 10*3/uL (ref 150.0–400.0)
RBC: 4.14 Mil/uL (ref 3.87–5.11)
RDW: 13.3 % (ref 11.5–15.5)
WBC: 9.5 10*3/uL (ref 4.0–10.5)

## 2019-03-19 LAB — LIPID PANEL
Cholesterol: 162 mg/dL (ref 0–200)
HDL: 61.7 mg/dL (ref >39.00)
LDL Cholesterol: 77 mg/dL (ref 0–99)
NonHDL: 100.33
Total CHOL/HDL Ratio: 3
Triglycerides: 118 mg/dL (ref 0.0–149.0)
VLDL: 23.6 mg/dL (ref 0.0–40.0)

## 2019-03-19 LAB — TSH: TSH: 1.47 u[IU]/mL (ref 0.35–4.50)

## 2019-03-25 ENCOUNTER — Telehealth (HOSPITAL_COMMUNITY): Payer: Self-pay | Admitting: *Deleted

## 2019-03-25 ENCOUNTER — Other Ambulatory Visit: Payer: Self-pay

## 2019-03-25 ENCOUNTER — Ambulatory Visit (INDEPENDENT_AMBULATORY_CARE_PROVIDER_SITE_OTHER): Payer: Federal, State, Local not specified - PPO | Admitting: Internal Medicine

## 2019-03-25 ENCOUNTER — Encounter: Payer: Self-pay | Admitting: Internal Medicine

## 2019-03-25 VITALS — BP 124/78 | HR 89 | Temp 97.9°F | Ht 65.0 in | Wt 132.0 lb

## 2019-03-25 DIAGNOSIS — R739 Hyperglycemia, unspecified: Secondary | ICD-10-CM

## 2019-03-25 DIAGNOSIS — I10 Essential (primary) hypertension: Secondary | ICD-10-CM | POA: Diagnosis not present

## 2019-03-25 DIAGNOSIS — Z0000 Encounter for general adult medical examination without abnormal findings: Secondary | ICD-10-CM | POA: Diagnosis not present

## 2019-03-25 DIAGNOSIS — E538 Deficiency of other specified B group vitamins: Secondary | ICD-10-CM

## 2019-03-25 DIAGNOSIS — E611 Iron deficiency: Secondary | ICD-10-CM

## 2019-03-25 DIAGNOSIS — I701 Atherosclerosis of renal artery: Secondary | ICD-10-CM

## 2019-03-25 DIAGNOSIS — E559 Vitamin D deficiency, unspecified: Secondary | ICD-10-CM

## 2019-03-25 MED ORDER — LISINOPRIL 20 MG PO TABS: 20.0000 mg | ORAL_TABLET | Freq: Every day | ORAL | 3 refills | Status: DC

## 2019-03-25 NOTE — Progress Notes (Signed)
Subjective:    Patient ID: Jane Mcdonald, female    DOB: Apr 16, 1956, 63 y.o.   MRN: [id number]  HPI   Here for wellness and f/u;  Overall doing ok;  Pt denies Chest pain, worsening SOB, DOE, wheezing, orthopnea, PND, worsening LE edema, palpitations, dizziness or syncope.  Pt denies neurological change such as new headache, facial or extremity weakness.  Pt denies polydipsia, polyuria, or low sugar symptoms. Pt states overall good compliance with treatment and medications, good tolerability, and has been trying to follow appropriate diet.  Pt denies worsening depressive symptoms, suicidal ideation or panic. No fever, night sweats, wt loss, loss of appetite, or other constitutional symptoms.  Pt states good ability with ADL's, has low fall risk, home safety reviewed and adequate, no other significant changes in hearing or vision, and only occasionally active with exercise.  Due for f/u renal artery duplex.  No new complaints Past Medical History:  Diagnosis Date   ANXIETY, SITUATIONAL 07/14/2010   FOOT PAIN, BILATERAL 03/30/2010   Glaucoma    sees optho/Dr. Katy Fitch every 6 months   Glaucoma (increased eye pressure) 02/01/2012   HYPERTENSION 07/14/2010   Palpitations    Renal artery stenosis, native, bilateral ([place]) 07/28/2010   Rosacea    SMOKER 07/14/2010   Past Surgical History:  Procedure Laterality Date   DILATION AND CURETTAGE OF UTERUS     s/p   OOPHORECTOMY     left only 2012, non malignant mass   TUBAL LIGATION      reports that she has never smoked. She has never used smokeless tobacco. She reports current alcohol use. She reports that she does not use drugs. family history includes Cancer in an other family member; Heart disease in her father; Hypertension in her mother; Nephrolithiasis in her father. No Known Allergies Current Outpatient Medications on File Prior to Visit  Medication Sig Dispense Refill   aspirin 81 MG EC tablet Take 1 tablet (81 mg total) by mouth  daily. Swallow whole. 30 tablet 12   cyclobenzaprine (FLEXERIL) 5 MG tablet Take 1 tablet (5 mg total) by mouth 3 (three) times daily as needed for muscle spasms. 60 tablet 1   diclofenac sodium (VOLTAREN) 1 % GEL Apply 2 g topically 4 (four) times daily. 100 g 5   latanoprost (XALATAN) 0.005 % ophthalmic solution 1 drop. Use as directed     Multiple Minerals-Vitamins (CALCIUM & VIT D3 BONE HEALTH) LIQD Take by mouth.     triamcinolone (NASACORT) 55 MCG/ACT AERO nasal inhaler Place 2 sprays into the nose daily. 1 Inhaler 12   Turmeric Curcumin 500 MG CAPS Take by mouth.     valACYclovir (VALTREX) 500 MG tablet Take 500 mg by mouth daily.       Vitamin D, Ergocalciferol, (DRISDOL) 1.25 MG (50000 UT) CAPS capsule Take 1 capsule (50,000 Units total) by mouth every 7 (seven) days. 12 capsule 0   No current facility-administered medications on file prior to visit.    Review of Systems Constitutional: Negative for other unusual diaphoresis, sweats, appetite or weight changes HENT: Negative for other worsening hearing loss, ear pain, facial swelling, mouth sores or neck stiffness.   Eyes: Negative for other worsening pain, redness or other visual disturbance.  Respiratory: Negative for other stridor or swelling Cardiovascular: Negative for other palpitations or other chest pain  Gastrointestinal: Negative for worsening diarrhea or loose stools, blood in stool, distention or other pain Genitourinary: Negative for hematuria, flank pain or other change in urine volume.  Musculoskeletal: Negative for myalgias or other joint swelling.  Skin: Negative for other color change, or other wound or worsening drainage.  Neurological: Negative for other syncope or numbness. Hematological: Negative for other adenopathy or swelling Psychiatric/Behavioral: Negative for hallucinations, other worsening agitation, SI, self-injury, or new decreased concentration All otherwise neg per pt    Objective:    Physical Exam BP 124/78    Pulse 89    Temp 97.9 F (36.6 C) (Oral)    Ht 5\' 5"  (1.651 m)    Wt 132 lb (59.9 kg)    SpO2 99%    BMI 21.97 kg/m  VS noted,  Constitutional: Pt is oriented to person, place, and time. Appears well-developed and well-nourished, in no significant distress and comfortable Head: Normocephalic and atraumatic  Eyes: Conjunctivae and EOM are normal. Pupils are equal, round, and reactive to light Right Ear: External ear normal without discharge Left Ear: External ear normal without discharge Nose: Nose without discharge or deformity Mouth/Throat: Oropharynx is without other ulcerations and moist  Neck: Normal range of motion. Neck supple. No JVD present. No tracheal deviation present or significant neck LA or mass Cardiovascular: Normal rate, regular rhythm, normal heart sounds and intact distal pulses.   Pulmonary/Chest: WOB normal and breath sounds without rales or wheezing  Abdominal: Soft. Bowel sounds are normal. NT. No HSM  Musculoskeletal: Normal range of motion. Exhibits no edema Lymphadenopathy: Has no other cervical adenopathy.  Neurological: Pt is alert and oriented to person, place, and time. Pt has normal reflexes. No cranial nerve deficit. Motor grossly intact, Gait intact Skin: Skin is warm and dry. No rash noted or new ulcerations Psychiatric:  Has normal mood and affect. Behavior is normal without agitation All otherwise neg per pt Lab Results  Component Value Date   WBC 9.5 03/19/2019   HGB 12.4 03/19/2019   HCT 36.7 03/19/2019   PLT 240.0 03/19/2019   GLUCOSE 97 03/19/2019   CHOL 162 03/19/2019   TRIG 118.0 03/19/2019   HDL 61.70 03/19/2019   LDLCALC 77 03/19/2019   ALT 14 03/19/2019   AST 18 03/19/2019   NA 139 03/19/2019   K 3.9 03/19/2019   CL 102 03/19/2019   CREATININE 0.76 03/19/2019   BUN 17 03/19/2019   CO2 30 03/19/2019   TSH 1.47 03/19/2019   HGBA1C 5.5 03/03/2016   MICROALBUR <0.7 03/03/2016      Assessment & Plan:

## 2019-03-25 NOTE — Patient Instructions (Signed)
Please continue all other medications as before, and refills have been done if requested.  Please have the pharmacy call with any other refills you may need.  Please continue your efforts at being more active, low cholesterol diet, and weight control.  You are otherwise up to date with prevention measures today.  Please keep your appointments with your specialists as you may have planned  You will be contacted regarding the referral for: renal artery duplex at [facility] facility  Please return in 1 year for your yearly visit, or sooner if needed, with Lab testing done 3-5 days before

## 2019-03-25 NOTE — Telephone Encounter (Addendum)
03/26/19 11:45 am left generic VM.   03/25/19 4:00 pm.  No answer, voicemail prompt does not identify patient no VM left.

## 2019-03-29 ENCOUNTER — Encounter: Payer: Self-pay | Admitting: Internal Medicine

## 2019-03-29 NOTE — Assessment & Plan Note (Signed)
For f/u renal artery duplex

## 2019-03-29 NOTE — Assessment & Plan Note (Signed)

## 2019-03-29 NOTE — Assessment & Plan Note (Signed)
stable overall by history and exam, recent data reviewed with pt, and pt to continue medical treatment as before,  to f/u any worsening symptoms or concerns  

## 2019-04-07 ENCOUNTER — Telehealth: Payer: Self-pay

## 2019-04-07 NOTE — Telephone Encounter (Signed)
Copied from [place] [id number]. Topic: Referral - Status >> Apr 07, 2019 10:29 AM Alanda Slim E wrote: Reason for CRM: Juliann Pulse from [facility] called and stated that they scheduled appt for Pt for Dec 7th but Pt was not happy with morning appt or the diet request for fasting and called back to cancel.  Order and request has been cancelled. Office just wanted to let Dr. Jenny Reichmann know

## 2019-04-10 ENCOUNTER — Telehealth: Payer: Self-pay

## 2019-04-10 NOTE — Telephone Encounter (Signed)
Order faxed to [phone]

## 2019-04-10 NOTE — Telephone Encounter (Signed)
Copied from [place] [phone]. Topic: General - Inquiry >> Apr 10, 2019  1:06 PM Mathis Bud wrote: Reason for CRM: Patient is requesting a change in her order for [place] imaging to vascular lab imaging [employer].  Fax [phone]

## 2019-04-14 ENCOUNTER — Encounter (HOSPITAL_COMMUNITY): Payer: Federal, State, Local not specified - PPO

## 2019-04-21 DIAGNOSIS — I701 Atherosclerosis of renal artery: Secondary | ICD-10-CM | POA: Diagnosis not present

## 2019-05-16 ENCOUNTER — Telehealth: Payer: Self-pay | Admitting: *Deleted

## 2019-05-16 NOTE — Telephone Encounter (Signed)
Very sorry, I am unable to access that information, I would instead contact the provider who ordered the test

## 2019-05-16 NOTE — Telephone Encounter (Signed)
LVM--[phone] ([facility])--requesting lab copy and need to fax to the office #[phone]

## 2019-05-16 NOTE — Telephone Encounter (Signed)
Pt informed of below. She states PCP ordered the test. I called the vascular center  @ [phone] and requested results from their HIM dept to be faxed to [place] for MD review, but never reached a live person.  Tobi Bastos, can you call them and request results,please?

## 2019-05-16 NOTE — Telephone Encounter (Signed)
Copied from CRM [phone] [phone]. Topic: General - Other >> May 13, 2019 11:47 AM Jaquita Rector A wrote: Reason for CRM: Patient called to inquire of Dr Jonny Ruiz about the results of a scan she had done at [facility] on 04/21/2019. Would like a call back with results please. Patient can be reached at Ph# [phone]

## 2019-06-17 ENCOUNTER — Ambulatory Visit (INDEPENDENT_AMBULATORY_CARE_PROVIDER_SITE_OTHER): Payer: Federal, State, Local not specified - PPO | Admitting: Sports Medicine

## 2019-06-17 ENCOUNTER — Other Ambulatory Visit: Payer: Self-pay

## 2019-06-17 ENCOUNTER — Encounter: Payer: Self-pay | Admitting: Sports Medicine

## 2019-06-17 VITALS — BP 140/72 | Ht 65.0 in | Wt 132.0 lb

## 2019-06-17 DIAGNOSIS — Q667 Congenital pes cavus, unspecified foot: Secondary | ICD-10-CM

## 2019-06-17 DIAGNOSIS — M722 Plantar fascial fibromatosis: Secondary | ICD-10-CM | POA: Diagnosis not present

## 2019-06-18 ENCOUNTER — Encounter: Payer: Self-pay | Admitting: Sports Medicine

## 2019-06-18 NOTE — Progress Notes (Signed)
Patient ID: Jane Mcdonald, female   DOB: November 24, 1955, 64 y.o.   MRN: [id number]  Patient presents today for new custom orthotics.  She has a known history of plantar fasciitis and a cavus foot.  Last orthotics were constructed in 2018.  They are starting to wear out.  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  This is really starting to affect her feet.  She is asking about the possibility of some work restrictions through [employer].  New custom orthotics were created for Jane Mcdonald today.  She found them comfortable prior to leaving the office.  Of note, the right orthotic was a little large for her boots but she is going to stretch her boot out in hopes of accommodating the orthotic.  If this is unsuccessful then she will return to the office and we can trim down the orthotic to better fit her boot.  The left orthotic fit perfectly and was comfortable. She will deliver the appropriate FMLA paperwork to my office which I will complete on her behalf.  I do think that it is reasonable for her to have periodic breaks throughout the year given her advanced age and chronic foot pain.  Patient was fitted for a : standard, cushioned, semi-rigid orthotic. The orthotic was heated and afterward the patient stood on the orthotic blank positioned on the orthotic stand. The patient was positioned in subtalar neutral position and 10 degrees of ankle dorsiflexion in a weight bearing stance. After completion of molding, a stable base was applied to the orthotic blank. The blank was ground to a stable position for weight bearing. Size: 8 Base: Blue EVA Posting: none Additional orthotic padding: small RT MT pad

## 2019-07-10 DIAGNOSIS — Z1231 Encounter for screening mammogram for malignant neoplasm of breast: Secondary | ICD-10-CM | POA: Diagnosis not present

## 2019-07-22 DIAGNOSIS — L570 Actinic keratosis: Secondary | ICD-10-CM | POA: Diagnosis not present

## 2019-07-22 DIAGNOSIS — L821 Other seborrheic keratosis: Secondary | ICD-10-CM | POA: Diagnosis not present

## 2019-07-22 DIAGNOSIS — L814 Other melanin hyperpigmentation: Secondary | ICD-10-CM | POA: Diagnosis not present

## 2019-07-22 DIAGNOSIS — L820 Inflamed seborrheic keratosis: Secondary | ICD-10-CM | POA: Diagnosis not present

## 2019-07-22 DIAGNOSIS — Z85828 Personal history of other malignant neoplasm of skin: Secondary | ICD-10-CM | POA: Diagnosis not present

## 2019-07-22 DIAGNOSIS — D2262 Melanocytic nevi of left upper limb, including shoulder: Secondary | ICD-10-CM | POA: Diagnosis not present

## 2019-07-22 DIAGNOSIS — C44619 Basal cell carcinoma of skin of left upper limb, including shoulder: Secondary | ICD-10-CM | POA: Diagnosis not present

## 2019-09-16 ENCOUNTER — Encounter: Payer: Self-pay | Admitting: Internal Medicine

## 2019-09-16 ENCOUNTER — Other Ambulatory Visit: Payer: Self-pay

## 2019-09-16 ENCOUNTER — Ambulatory Visit: Payer: Federal, State, Local not specified - PPO | Admitting: Internal Medicine

## 2019-09-16 VITALS — BP 120/70 | HR 77 | Temp 98.9°F | Ht 65.0 in | Wt 135.0 lb

## 2019-09-16 DIAGNOSIS — J309 Allergic rhinitis, unspecified: Secondary | ICD-10-CM | POA: Diagnosis not present

## 2019-09-16 DIAGNOSIS — I10 Essential (primary) hypertension: Secondary | ICD-10-CM | POA: Diagnosis not present

## 2019-09-16 DIAGNOSIS — H9192 Unspecified hearing loss, left ear: Secondary | ICD-10-CM

## 2019-09-16 NOTE — Assessment & Plan Note (Addendum)
stable overall by history and exam, recent data reviewed with pt, and pt to continue medical treatment as before,  to f/u any worsening symptoms or concerns, cont nasaocrt

## 2019-09-16 NOTE — Assessment & Plan Note (Signed)
stable overall by history and exam, recent data reviewed with pt, and pt to continue medical treatment as before,  to f/u any worsening symptoms or concerns  

## 2019-09-16 NOTE — Progress Notes (Signed)
Subjective:    Patient ID: Jane Mcdonald, female    DOB: [birth year], 64 y.o.   MRN: [id number]  HPI   Here to f/u; overall doing ok,  Pt denies chest pain, increasing sob or doe, wheezing, orthopnea, PND, increased LE swelling, palpitations, dizziness or syncope.  Pt denies new neurological symptoms such as new headache, or facial or extremity weakness or numbness.  Pt denies polydipsia, polyuria, or low sugar episode.  Pt states overall good compliance with meds, mostly trying to follow appropriate diet, with wt overall stable,  but little exercise however.  Does also have 1 wk onset left ear hearing loss with popping off and on during the day, no pain, fever, swelling or ear d/c.  Does have several wks ongoing nasal allergy symptoms with clearish congestion, itch and sneezing, without fever, pain, ST, cough, swelling or wheezing. Past Medical History:  Diagnosis Date  . ANXIETY, SITUATIONAL 07/14/2010  . FOOT PAIN, BILATERAL 03/30/2010  . Glaucoma    sees optho/Dr. Katy Fitch every 6 months  . Glaucoma (increased eye pressure) 02/01/2012  . HYPERTENSION 07/14/2010  . Palpitations   . Renal artery stenosis, native, bilateral ([place]) 07/28/2010  . Rosacea   . SMOKER 07/14/2010   Past Surgical History:  Procedure Laterality Date  . DILATION AND CURETTAGE OF UTERUS     s/p  . OOPHORECTOMY     left only 2012, non malignant mass  . TUBAL LIGATION      reports that she has never smoked. She has never used smokeless tobacco. She reports current alcohol use. She reports that she does not use drugs. family history includes Cancer in an other family member; Heart disease in her father; Hypertension in her mother; Nephrolithiasis in her father. No Known Allergies Current Outpatient Medications on File Prior to Visit  Medication Sig Dispense Refill  . aspirin 81 MG EC tablet Take 1 tablet (81 mg total) by mouth daily. Swallow whole. 30 tablet 12  . diclofenac sodium (VOLTAREN) 1 % GEL Apply 2 g topically 4  (four) times daily. 100 g 5  . latanoprost (XALATAN) 0.005 % ophthalmic solution 1 drop. Use as directed    . lisinopril (ZESTRIL) 20 MG tablet Take 1 tablet (20 mg total) by mouth daily. 90 tablet 3  . Multiple Vitamins-Minerals (VITAMIN D3 COMPLETE PO) Take by mouth.    . triamcinolone (NASACORT) 55 MCG/ACT AERO nasal inhaler Place 2 sprays into the nose daily. 1 Inhaler 12   No current facility-administered medications on file prior to visit.   Review of Systems All otherwise neg per pt     Objective:   Physical Exam BP 120/70 (BP Location: Left Arm, Patient Position: Sitting, Cuff Size: Large)   Pulse 77   Temp 98.9 F (37.2 C) (Oral)   Ht 5\' 5"  (1.651 m)   Wt 135 lb (61.2 kg)   SpO2 99%   BMI 22.47 kg/m  VS noted,  Constitutional: Pt appears in NAD HENT: Head: NCAT.  Right Ear: External ear normal.  Left Ear: External ear normal.  Left ear canal irrigated of wax impaction and hearing improved  Eyes: . Pupils are equal, round, and reactive to light. Conjunctivae and EOM are normal Nose: without d/c or deformity Neck: Neck supple. Gross normal ROM Cardiovascular: Normal rate and regular rhythm.   Pulmonary/Chest: Effort normal and breath sounds without rales or wheezing.  Neurological: Pt is alert. At baseline orientation, motor grossly intact Skin: Skin is warm. No rashes, other new lesions, no  LE edema Psychiatric: Pt behavior is normal without agitation  All otherwise neg per pt Lab Results  Component Value Date   WBC 9.5 03/19/2019   HGB 12.4 03/19/2019   HCT 36.7 03/19/2019   PLT 240.0 03/19/2019   GLUCOSE 97 03/19/2019   CHOL 162 03/19/2019   TRIG 118.0 03/19/2019   HDL 61.70 03/19/2019   LDLCALC 77 03/19/2019   ALT 14 03/19/2019   AST 18 03/19/2019   NA 139 03/19/2019   K 3.9 03/19/2019   CL 102 03/19/2019   CREATININE 0.76 03/19/2019   BUN 17 03/19/2019   CO2 30 03/19/2019   TSH 1.47 03/19/2019   HGBA1C 5.5 03/03/2016   MICROALBUR <0.7 03/03/2016        Assessment & Plan:

## 2019-09-16 NOTE — Patient Instructions (Signed)
Your left ear canal was irrigated  Please continue all other medications as before, and refills have been done if requested.  Please have the pharmacy call with any other refills you may need.  Please continue your efforts at being more active, low cholesterol diet, and weight control.  You are otherwise up to date with prevention measures today.  Please keep your appointments with your specialists as you may have planned

## 2019-09-16 NOTE — Assessment & Plan Note (Addendum)
Improved s/p irrigation  I spent 31 minutes in preparing to see the patient by review of recent labs, imaging and procedures, obtaining and reviewing separately obtained history, communicating with the patient and family or caregiver, ordering medications, tests or procedures, and documenting clinical information in the EHR including the differential Dx, treatment, and any further evaluation and other management of left hearing loss, allergies, htn

## 2019-10-01 ENCOUNTER — Ambulatory Visit (INDEPENDENT_AMBULATORY_CARE_PROVIDER_SITE_OTHER): Payer: Federal, State, Local not specified - PPO

## 2019-10-01 ENCOUNTER — Other Ambulatory Visit: Payer: Self-pay

## 2019-10-01 ENCOUNTER — Ambulatory Visit: Payer: Federal, State, Local not specified - PPO | Admitting: Family Medicine

## 2019-10-01 ENCOUNTER — Ambulatory Visit: Payer: Self-pay

## 2019-10-01 ENCOUNTER — Encounter: Payer: Self-pay | Admitting: Family Medicine

## 2019-10-01 VITALS — BP 132/74 | HR 78 | Ht 65.0 in | Wt 137.8 lb

## 2019-10-01 DIAGNOSIS — M25511 Pain in right shoulder: Secondary | ICD-10-CM

## 2019-10-01 DIAGNOSIS — M19011 Primary osteoarthritis, right shoulder: Secondary | ICD-10-CM | POA: Diagnosis not present

## 2019-10-01 DIAGNOSIS — G8929 Other chronic pain: Secondary | ICD-10-CM | POA: Diagnosis not present

## 2019-10-01 NOTE — Patient Instructions (Signed)
Thank you for coming in today. Plan for PT at [facility] for Xray today.  Recheck with me or Dr Katrinka Blazing if not improve.  Could do injection or MRI if needed.

## 2019-10-01 NOTE — Progress Notes (Signed)
I, Christoper Fabian, LAT, ATC, am serving as scribe for Dr. Clementeen Graham.  Jane Mcdonald is a 64 y.o. female who presents to [facility] today for f/u of R shoulder pain.  She was last seen by Dr. Katrinka Blazing on 04/16/18 for her R shoulder and R elbow and was provided w/ a HEP.  She was also advised to use a counterforce strap for her R elbow, provided a Pennsaid sample and advised to use proper lifting mechanics.  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  Since her last visit w/ Dr. Ival Bible, pt reports pain at her R anterior-superior shoulder.  She has been using Voltaren gel and uses multiple braces and compression sleeves on her R UE from her wrist to elbow.  She denies any mechanical shoulder symptoms.   Pertinent review of systems: No fevers or chills  Relevant historical information: Renal artery stenosis. Works at the [employer] lifting heavy objects.  Would like to be able to take 1 day off per week which she has been doing a bit which helps some. History right shoulder bursitis and biceps tendinitis.   Exam:  BP 132/74 (BP Location: Left Arm, Patient Position: Sitting, Cuff Size: Normal)   Pulse 78   Ht 5\' 5"  (1.651 m)   Wt 137 lb 12.8 oz (62.5 kg)   SpO2 97%   BMI 22.93 kg/m  General: Well Developed, well nourished, and in no acute distress.   MSK: C-spine normal-appearing normal motion. Right shoulder normal-appearing nontender normal motion. Strength intact abduction external/internal rotation. Negative Hawkins and Neer's test. Negative Yergason's and speeds test. Negative crossover arm compression test. Negative O'Brien's test. Negative sulcus or relocation or clunk sign.    Lab and Radiology Results  X-ray images right shoulder obtained today personally independent reviewed. AC DJD.  No acute fractures. Await formal radiology review  Diagn[facility] MSK Ultrasound of: Right  shoulder Steps tendon intact in bicipital groove normal-appearing Subscapularis tendon normal-appearing without obvious coracoid impingement. Supraspinatus tendon slight hypoechoic change bursal side of supraspinatus tendon without increased Doppler activity in tendon.  Possible old rotator cuff tear that is now healed. Slight increased Doppler activity at bursa near acromion. Infraspinatus tendon intact.  Slight increased hyperechoic change at distal tendon insertion indicating possible calcific tendinopathy. AC joint narrowed degenerative with effusion. 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  AC DJD.    Assessment and Plan: 64 y.o. female with shoulder pain symptoms are somewhat mild.  Pain likely secondary to increased lifting demands at work.  Work note written today to allow reduced days of week.  Plan for physical therapy and home exercise program.  Recheck back as needed.  Reasonable to proceed with MRI or injection in the future if not improved.    Orders Placed This Encounter  Procedures  . 77 LIMITED JOINT SPACE STRUCTURES UP RIGHT(NO LINKED CHARGES)    Order Specific Question:   Reason for Exam (SYMPTOM  OR DIAGNOSIS REQUIRED)    Answer:   R shoulder pain    Order Specific Question:   Preferred imaging location?    Answer:   [facility] [facility]  . DG Shoulder Right    Standing Status:   Future    Standing Expiration Date:   09/30/2020    Order Specific Question:   Reason for Exam (SYMPTOM  OR DIAGNOSIS REQUIRED)    Answer:  shoulder pain r    Order Specific Question:   Preferred imaging location?    Answer:   [facility]    Order Specific Question:   Radiology Contrast Protocol - do NOT remove file path    Answer:   [email or web address]  . Ambulatory referral to Physical Therapy    Referral Priority:   Routine    Referral Type:   Physical Medicine    Referral Reason:    Specialty Services Required    Requested Specialty:   Physical Therapy   No orders of the defined types were placed in this encounter.    Discussed warning signs or symptoms. Please see discharge instructions. Patient expresses understanding.   The above documentation has been reviewed and is accurate and complete Lynne Leader, M.D.

## 2019-10-02 NOTE — Progress Notes (Signed)
Right shoulder x-ray shows medium small joint at the top of the shoulder arthritis.  This is the [facility] joint.  We saw this on ultrasound.

## 2019-10-15 ENCOUNTER — Other Ambulatory Visit: Payer: Self-pay

## 2019-10-15 ENCOUNTER — Encounter: Payer: Self-pay | Admitting: Rehabilitative and Restorative Service Providers"

## 2019-10-15 ENCOUNTER — Ambulatory Visit (INDEPENDENT_AMBULATORY_CARE_PROVIDER_SITE_OTHER): Payer: Federal, State, Local not specified - PPO | Admitting: Rehabilitative and Restorative Service Providers"

## 2019-10-15 DIAGNOSIS — G2589 Other specified extrapyramidal and movement disorders: Secondary | ICD-10-CM | POA: Diagnosis not present

## 2019-10-15 DIAGNOSIS — M6281 Muscle weakness (generalized): Secondary | ICD-10-CM

## 2019-10-15 DIAGNOSIS — M5384 Other specified dorsopathies, thoracic region: Secondary | ICD-10-CM | POA: Diagnosis not present

## 2019-10-15 DIAGNOSIS — M25511 Pain in right shoulder: Secondary | ICD-10-CM | POA: Diagnosis not present

## 2019-10-15 DIAGNOSIS — G8929 Other chronic pain: Secondary | ICD-10-CM

## 2019-10-15 DIAGNOSIS — R29898 Other symptoms and signs involving the musculoskeletal system: Secondary | ICD-10-CM | POA: Diagnosis not present

## 2019-10-15 NOTE — Therapy (Signed)
[facility] [place] [address] [place], [place], [address] Phone: [phone]   Fax:  [phone]  Physical Therapy Evaluation  Patient Details  Name: Jane Mcdonald MRN: [id number] Date of Birth: Feb 26, 1956 Referring Provider (PT): Dr Clementeen Graham    Encounter Date: 10/15/2019  PT End of Session - 10/15/19 1348    Visit Number  1    Number of Visits  12    Date for PT Re-Evaluation  11/26/19    PT Start Time  1348    PT Stop Time  1452    PT Time Calculation (min)  64 min    Activity Tolerance  Patient tolerated treatment well       Past Medical History:  Diagnosis Date  . ANXIETY, SITUATIONAL 07/14/2010  . FOOT PAIN, BILATERAL 03/30/2010  . Glaucoma    sees optho/Dr. Dione Booze every 6 months  . Glaucoma (increased eye pressure) 02/01/2012  . HYPERTENSION 07/14/2010  . Palpitations   . Renal artery stenosis, native, bilateral (HCC) 07/28/2010  . Rosacea   . SMOKER 07/14/2010    Past Surgical History:  Procedure Laterality Date  . DILATION AND CURETTAGE OF UTERUS     s/p  . OOPHORECTOMY     left only 2012, non malignant mass  . TUBAL LIGATION      There were no vitals filed for this visit.   Subjective Assessment - 10/15/19 1357    Subjective  Patient reports discomfort and dull ache in the Rt shoulder which has gradually gotten worse in the past months. Symptoms have been present for the past 2 years with no known injury. Work is repetitive and lifting.    Pertinent History  HTN; arthritis    Patient Stated Goals  prevent surgery and strengthen shoulder    Currently in Pain?  Yes    Pain Score  2     Pain Location  Shoulder    Pain Orientation  Right    Pain Descriptors / Indicators  Aching;Dull;Discomfort;Sore    Pain Type  Chronic pain    Pain Radiating Towards  into the upper shoulder; into Rt elbow and arm    Pain Onset  More than a month ago    Pain Frequency  Intermittent    Aggravating Factors   lifting;  reaching; work activities; driving    Pain Relieving Factors  voltaren cream; OTC meds         [facility] PT Assessment - 10/15/19 0001      Assessment   Medical Diagnosis  Rt shoulder pain; Rt lateral epicondylitis     Referring Provider (PT)  Dr Clementeen Graham     Onset Date/Surgical Date  10/06/17   gradually increasing in the past months   Hand Dominance  Right    Next MD Visit  PRN     Prior Therapy  none       Precautions   Precautions  None      Balance Screen   Has the patient fallen in the past 6 months  No    Has the patient had a decrease in activity level because of a fear of falling?   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     Leisure  household chores; gardening  Observation/Other Assessments   Focus on Therapeutic Outcomes (FOTO)   38% limitation       Sensation   Additional Comments  fingers tingling in both hands on an intermittent basis       Posture/Postural Control   Posture Comments  head forward; shoudlers rounded and elevated; head of the humerus anterior in orientation; scapulae abducted and rotated along the thoracic wall       AROM   Right Shoulder Extension  66 Degrees    Right Shoulder Flexion  142 Degrees    Right Shoulder ABduction  154 Degrees    Right Shoulder External Rotation  90 Degrees    Left Shoulder Extension  52 Degrees    Left Shoulder Flexion  150 Degrees    Left Shoulder ABduction  150 Degrees    Left Shoulder External Rotation  94 Degrees    Right/Left Elbow  --   WFL's bilat UE's    Right/Left Forearm  --   WFL's bilat UE's    Right/Left Wrist  --   WFL's bilat UE's    Cervical Flexion  49    Cervical Extension  59    Cervical - Right Side Bend  37    Cervical - Left Side Bend  32    Cervical - Right Rotation  70    Cervical - Left  Rotation  62      Strength   Overall Strength Comments  WFL's bilat UE's except Rt middle 4+/5  and lower trap 5-/5      Palpation   Spinal mobility  hypomobile thoracic and cervical PA mobs     Palpation comment  muscular tightness Rt > Lt pecs; upper trap; leveator; teres; ant/lat/post cervical musculature       Special Tests   Other special tests  (+) neural tension test bilaat UE's Rt > Lt                   Objective measurements completed on examination: See above findings.      [facility] Adult PT Treatment/Exercise - 10/15/19 0001      Self-Care   Self-Care  --   myofacial ball release work      Neuro Re-ed    Neuro Re-ed Details   work on postural correction engaging posterior shoulder girdle       Shoulder Exercises: Standing   Other Standing Exercises  axial extension 10 sec x 5; scap squeeze 10 sec x 5; L's x 10; w's x 10 with noodle       Shoulder Exercises: Stretch   Other Shoulder Stretches  doorway 30 sec x 30 sec x 2 each position     Other Shoulder Stretches  prolonged snow angel ~ 2 min UE's at 90 deg abd       Moist Heat Therapy   Number Minutes Moist Heat  10 Minutes    Moist Heat Location  Cervical;Shoulder      Electrical Stimulation   Electrical Stimulation Location  Rt posterior shoulder girdle     Electrical Stimulation Action  TENS     Electrical Stimulation Parameters  to tolerance     Electrical Stimulation Goals  Tone             PT Education - 10/15/19 1453    Education Details  HEP POC myofacial release    Person(s) Educated  Patient    Methods  Explanation;Demonstration;Tactile cues;Verbal cues;Handout    Comprehension  Verbalized understanding;Returned demonstration;Verbal cues  required;Tactile cues required          PT Long Term Goals - 10/15/19 1514      PT LONG TERM GOAL #1   Title  Improve posture and alignment with posterior shoulder girdle engaged and increased upright posture    Time  6    Period  Weeks     Status  New    Target Date  11/26/19      PT LONG TERM GOAL #2   Title  Increase Rt shoulder AROM to equal or greater that AROM Lt shoulder    Time  6    Period  Weeks    Status  New    Target Date  11/26/19      PT LONG TERM GOAL #3   Title  Increase cervical ROM by 5-7 degrees in Lt lateral flexion and rotation    Time  6    Period  Weeks    Status  New    Target Date  11/26/19      PT LONG TERM GOAL #4   Title  Independent in HEP    Time  6    Period  Weeks    Status  New    Target Date  11/26/19      PT LONG TERM GOAL #5   Title  Improve FOTO to </= 33% limitation    Time  6    Period  Weeks    Status  New    Target Date  11/26/19             Plan - 10/15/19 1509    Clinical Impression Statement  Patient presents with muscular tightness through the Rt upper quarter; limited UE and cervical ROM; posterior shoulder girdle weakness; abnormal posture; positive neural tension test; tightness and discomfort on a daily basis. patient will benefit from PT to address problems identified.    Stability/Clinical Decision Making  Stable/Uncomplicated    Clinical Decision Making  Low    Rehab Potential  Good    PT Frequency  1x / week    PT Duration  6 weeks    PT Treatment/Interventions  Patient/family education;ADLs/Self Care Home Management;Cryotherapy;Electrical Stimulation;Iontophoresis 4mg /ml Dexamethasone;Moist Heat;Traction;Ultrasound;Therapeutic activities;Therapeutic exercise;Neuromuscular re-education;Manual techniques;Dry needling;Taping    PT Next Visit Plan  review HEP; progress with posterior shoulder girdle strengthening and postural correction; manual work; through cervical and upper trap/pec musculature; modalities as indicated    PT Home Exercise Plan  [email or web address]    Consulted and Agree with Plan of Care  Patient       Patient will benefit from skilled therapeutic intervention in order to improve the following deficits and impairments:     Visit  Diagnosis: Chronic right shoulder pain - Plan: PT plan of care cert/re-cert  Thoracic spine dysfunction - Plan: PT plan of care cert/re-cert  Other symptoms and signs involving the musculoskeletal system - Plan: PT plan of care cert/re-cert  Scapular dyskinesis - Plan: PT plan of care cert/re-cert  Muscle weakness (generalized) - Plan: PT plan of care cert/re-cert     Problem List Patient Active Problem List   Diagnosis Date Noted  . Right lateral epicondylitis 04/16/2018  . Chronic bursitis of right shoulder 04/16/2018  . Bicipital tendonitis of shoulder, right 03/19/2018  . Allergic rhinitis 03/19/2018  . Right flank pain 06/07/2016  . Chest pain 06/07/2016  . Hearing loss, left 03/10/2016  . Strain of flexor muscle of right hip 03/01/2015  . Right groin pain 02/18/2015  .  Plantar fasciitis of right foot 02/16/2014  . Essential hypertension 02/15/2014  . Mass of left side of neck 05/20/2013  . Glaucoma (increased eye pressure) 02/01/2012  . Former smoker 02/01/2012  . Palpitations 08/04/2010  . Preventative health care 08/04/2010  . Right renal artery stenosis (HCC) 07/28/2010  . ANXIETY, SITUATIONAL 07/14/2010  . FOOT PAIN, BILATERAL 03/30/2010    Jane Mcdonald Rober Minion PT, MPH  10/15/2019, 3:22 PM  [facility] [address] [place], [place], [address] Phone: [phone]   Fax:  [phone]  Name: Jane Mcdonald MRN: [id number] Date of Birth: 18-Jan-1956

## 2019-10-15 NOTE — Patient Instructions (Addendum)
Neurovascular: Median Nerve Stretch - Supine    Lie with neck supported, side-bent away from moving arm. Hold right arm out to side, elbow straight, fingers and wrist bent back. Slowly straighten elbow as far as possible without pain. Hold for _60___ seconds. Repeat _2___ times per set. Do _2 times/day  Nerve glide - arms out to side resting on surface  Wrist windshield wipers keeping arms still  Shoulder about 90 degrees from body  Lying on back    Access Code: [email or web address]: [email or web address]: 06/09/2021Prepared by: Lacrystal Barbe HoltExercises  Seated Cervical Retraction - 2 x daily - 7 x weekly - 1 sets - 3 reps - 10sec hold  Seated Scapular Retraction - 2 x daily - 7 x weekly - 1 sets - 3 reps - 10 sec hold  Shoulder External Rotation and Scapular Retraction - 2 x daily - 7 x weekly - 1 sets - 10 reps - 1-2 sec hold  Shoulder External Rotation in 45 Degrees Abduction - 2 x daily - 7 x weekly - 1 sets - 10 reps - 1-2 sec hold  Doorway Pec Stretch at 60 Degrees Abduction - 3 x daily - 7 x weekly - 3 reps - 1 sets  Doorway Pec Stretch at 90 Degrees Abduction - 3 x daily - 7 x weekly - 3 reps - 1 sets - 30 seconds hold  Doorway Pec Stretch at 120 Degrees Abduction - 3 x daily - 7 x weekly - 3 reps - 1 sets - 30 second hold hold  Hooklying Shoulder T - 2 x daily - 7 x weekly - 1 sets - 1 reps - 2-5 min hold Patient Education  TENS Unit    TENS UNIT: This is helpful for muscle pain and spasm.   Search and Purchase a TENS 7000 2nd edition at www.tenspros.com. It should be less than $30.     TENS unit instructions: Do not shower or bathe with the unit on Turn the unit off before removing electrodes or batteries If the electrodes lose stickiness add a drop of water to the electrodes after they are disconnected from the unit and place on plastic sheet. If you continued to have difficulty, call the TENS unit company to purchase more electrodes. Do not apply lotion on the  skin area prior to use. Make sure the skin is clean and dry as this will help prolong the life of the electrodes. After use, always check skin for unusual red areas, rash or other skin difficulties. If there are any skin problems, does not apply electrodes to the same area. Never remove the electrodes from the unit by pulling the wires. Do not use the TENS unit or electrodes other than as directed. Do not change electrode placement without consultating your therapist or physician. Keep 2 fingers with between each electrode.

## 2019-10-24 ENCOUNTER — Ambulatory Visit (INDEPENDENT_AMBULATORY_CARE_PROVIDER_SITE_OTHER): Payer: Federal, State, Local not specified - PPO | Admitting: Rehabilitative and Restorative Service Providers"

## 2019-10-24 ENCOUNTER — Encounter: Payer: Self-pay | Admitting: Rehabilitative and Restorative Service Providers"

## 2019-10-24 ENCOUNTER — Other Ambulatory Visit: Payer: Self-pay

## 2019-10-24 DIAGNOSIS — M25511 Pain in right shoulder: Secondary | ICD-10-CM | POA: Diagnosis not present

## 2019-10-24 DIAGNOSIS — M5384 Other specified dorsopathies, thoracic region: Secondary | ICD-10-CM | POA: Diagnosis not present

## 2019-10-24 DIAGNOSIS — M6281 Muscle weakness (generalized): Secondary | ICD-10-CM

## 2019-10-24 DIAGNOSIS — G2589 Other specified extrapyramidal and movement disorders: Secondary | ICD-10-CM | POA: Diagnosis not present

## 2019-10-24 DIAGNOSIS — R29898 Other symptoms and signs involving the musculoskeletal system: Secondary | ICD-10-CM

## 2019-10-24 DIAGNOSIS — G8929 Other chronic pain: Secondary | ICD-10-CM

## 2019-10-24 NOTE — Patient Instructions (Signed)
Access Code: [email or web address]: [email or web address]: 06/18/2021Prepared by: Nagi Furio HoltExercises  Seated Cervical Retraction - 2 x daily - 7 x weekly - 1 sets - 3 reps - 10sec hold  Seated Scapular Retraction - 2 x daily - 7 x weekly - 1 sets - 3 reps - 10 sec hold  Shoulder External Rotation and Scapular Retraction - 2 x daily - 7 x weekly - 1 sets - 10 reps - 1-2 sec hold  Shoulder External Rotation in 45 Degrees Abduction - 2 x daily - 7 x weekly - 1 sets - 10 reps - 1-2 sec hold  Doorway Pec Stretch at 60 Degrees Abduction - 3 x daily - 7 x weekly - 3 reps - 1 sets  Doorway Pec Stretch at 90 Degrees Abduction - 3 x daily - 7 x weekly - 3 reps - 1 sets - 30 seconds hold  Doorway Pec Stretch at 120 Degrees Abduction - 3 x daily - 7 x weekly - 3 reps - 1 sets - 30 second hold hold  Hooklying Shoulder T - 2 x daily - 7 x weekly - 1 sets - 1 reps - 2-5 min hold  Shoulder External Rotation and Scapular Retraction with Resistance - 2 x daily - 7 x weekly - 1-2 sets - 10 reps - 3 sec hold  Scapular Retraction with Resistance - 2 x daily - 7 x weekly - 1-2 sets - 10 reps - 3 sec hold  Scapular Retraction with Resistance Advanced - 2 x daily - 7 x weekly - 1-2 sets - 10 reps - 3 sec hold  Shoulder ER Stretch in Abduction - 2 x daily - 7 x weekly - 1 sets - 3 reps - 30 sec hold

## 2019-10-24 NOTE — Therapy (Signed)
[facility] [place] [address] [place], [place], [address] Phone: [phone]   Fax:  [phone]  Physical Therapy Treatment  Patient Details  Name: Jane Mcdonald MRN: [id number] Date of Birth: 11/25/1955 Referring Provider (PT): Dr Clementeen Graham    Encounter Date: 10/24/2019   PT End of Session - 10/24/19 1319    Visit Number 2    Number of Visits 12    Date for PT Re-Evaluation 11/26/19    PT Start Time 1318    PT Stop Time 1409    PT Time Calculation (min) 51 min    Activity Tolerance Patient tolerated treatment well           Past Medical History:  Diagnosis Date  . ANXIETY, SITUATIONAL 07/14/2010  . FOOT PAIN, BILATERAL 03/30/2010  . Glaucoma    sees optho/Dr. Dione Booze every 6 months  . Glaucoma (increased eye pressure) 02/01/2012  . HYPERTENSION 07/14/2010  . Palpitations   . Renal artery stenosis, native, bilateral (HCC) 07/28/2010  . Rosacea   . SMOKER 07/14/2010    Past Surgical History:  Procedure Laterality Date  . DILATION AND CURETTAGE OF UTERUS     s/p  . OOPHORECTOMY     left only 2012, non malignant mass  . TUBAL LIGATION      There were no vitals filed for this visit.   Subjective Assessment - 10/24/19 1319    Subjective Rt Shoulder is doing okay - just gets twinges. Working on her exercises at home and found a noodle for home    Currently in Pain? No/denies                             [facility] Adult PT Treatment/Exercise - 10/24/19 0001      Shoulder Exercises: Supine   Other Supine Exercises supine stretch lying on noodle along spine and across Thoracic spine for ~ 1-2 min each direction       Shoulder Exercises: Standing   Extension Strengthening;Both;20 reps;Theraband    Theraband Level (Shoulder Extension) Level 3 (Green)    Row Strengthening;Both;20 reps;Theraband    Theraband Level (Shoulder Row) Level 3 (Green)    Retraction Strengthening;Both;20 reps;Theraband     Theraband Level (Shoulder Retraction) Level 1 (Yellow)    Other Standing Exercises axial extension 10 sec x 5; scap squeeze 10 sec x 5; L's x 10; w's x 10 with noodle       Shoulder Exercises: ROM/Strengthening   UBE (Upper Arm Bike) L3 x 1 min fwd/1 min back       Shoulder Exercises: Stretch   Wall Stretch - ABduction Limitations T stretch at wall 30 sec x 3 reps stretching into horizontal abduction     Other Shoulder Stretches doorway 30 sec x 30 sec x 2 each position     Other Shoulder Stretches prolonged snow angel ~ 2 min UE's at 90 deg abd       Moist Heat Therapy   Number Minutes Moist Heat 10 Minutes    Moist Heat Location Cervical;Shoulder      Electrical Stimulation   Electrical Stimulation Location Rt posterior shoulder girdle     Electrical Stimulation Action IFC    Electrical Stimulation Parameters to tolerance    Electrical Stimulation Goals Tone      Manual Therapy   Manual therapy comments pt supine     Joint Mobilization thoracic CPA mobs pt supine    Soft  tissue mobilization deep tissue work Rt anterior shoulder through the pecs/deltoid/teres; clavicle and 1st rib junction(very tight) working on Rt upper quarter     Myofascial Release anterior chest     Passive ROM Rt shoulder flexion and ER     Manual Traction through Rt UE long arm traction 2-3 reps for 10-15 sec                   PT Education - 10/24/19 1347    Education Details HEP    Person(s) Educated Patient    Methods Explanation;Demonstration;Tactile cues;Verbal cues;Handout    Comprehension Verbalized understanding;Returned demonstration;Verbal cues required;Tactile cues required               PT Long Term Goals - 10/15/19 1514      PT LONG TERM GOAL #1   Title Improve posture and alignment with posterior shoulder girdle engaged and increased upright posture    Time 6    Period Weeks    Status New    Target Date 11/26/19      PT LONG TERM GOAL #2   Title Increase Rt shoulder  AROM to equal or greater that AROM Lt shoulder    Time 6    Period Weeks    Status New    Target Date 11/26/19      PT LONG TERM GOAL #3   Title Increase cervical ROM by 5-7 degrees in Lt lateral flexion and rotation    Time 6    Period Weeks    Status New    Target Date 11/26/19      PT LONG TERM GOAL #4   Title Independent in HEP    Time 6    Period Weeks    Status New    Target Date 11/26/19      PT LONG TERM GOAL #5   Title Improve FOTO to </= 33% limitation    Time 6    Period Weeks    Status New    Target Date 11/26/19                 Plan - 10/24/19 1324    Clinical Impression Statement Good response with initial exercise program. Progressed with thoracic mobilization and posterior shoulder girdle strengthening exercises without difficulty. Progressing well toward goals of therapy.    Rehab Potential Good    PT Frequency 1x / week    PT Duration 6 weeks    PT Treatment/Interventions Patient/family education;ADLs/Self Care Home Management;Cryotherapy;Electrical Stimulation;Iontophoresis 4mg /ml Dexamethasone;Moist Heat;Traction;Ultrasound;Therapeutic activities;Therapeutic exercise;Neuromuscular re-education;Manual techniques;Dry needling;Taping    PT Next Visit Plan review HEP; progress with posterior shoulder girdle strengthening and postural correction; manual work; through cervical and upper trap/pec musculature; modalities as indicated - trial of prone thoracic strengthening, shd flexion stretch on counter    PT Home Exercise Plan [email or web address]    Consulted and Agree with Plan of Care Patient           Patient will benefit from skilled therapeutic intervention in order to improve the following deficits and impairments:     Visit Diagnosis: Chronic right shoulder pain  Thoracic spine dysfunction  Other symptoms and signs involving the musculoskeletal system  Scapular dyskinesis  Muscle weakness (generalized)     Problem List Patient Active  Problem List   Diagnosis Date Noted  . Right lateral epicondylitis 04/16/2018  . Chronic bursitis of right shoulder 04/16/2018  . Bicipital tendonitis of shoulder, right 03/19/2018  . Allergic rhinitis 03/19/2018  . Right  flank pain 06/07/2016  . Chest pain 06/07/2016  . Hearing loss, left 03/10/2016  . Strain of flexor muscle of right hip 03/01/2015  . Right groin pain 02/18/2015  . Plantar fasciitis of right foot 02/16/2014  . Essential hypertension 02/15/2014  . Mass of left side of neck 05/20/2013  . Glaucoma (increased eye pressure) 02/01/2012  . Former smoker 02/01/2012  . Palpitations 08/04/2010  . Preventative health care 08/04/2010  . Right renal artery stenosis (HCC) 07/28/2010  . ANXIETY, SITUATIONAL 07/14/2010  . FOOT PAIN, BILATERAL 03/30/2010    Chidubem Chaires Rober Minion PT, MPH  10/24/2019, 2:06 PM  [facility] [address] [place], [place], [address] Phone: [phone]   Fax:  [phone]  Name: Jane Mcdonald MRN: [id number] Date of Birth: [birth year]

## 2019-11-05 ENCOUNTER — Encounter: Payer: Self-pay | Admitting: Rehabilitative and Restorative Service Providers"

## 2019-11-05 ENCOUNTER — Ambulatory Visit (INDEPENDENT_AMBULATORY_CARE_PROVIDER_SITE_OTHER): Payer: Federal, State, Local not specified - PPO | Admitting: Rehabilitative and Restorative Service Providers"

## 2019-11-05 ENCOUNTER — Other Ambulatory Visit: Payer: Self-pay

## 2019-11-05 DIAGNOSIS — M5384 Other specified dorsopathies, thoracic region: Secondary | ICD-10-CM | POA: Diagnosis not present

## 2019-11-05 DIAGNOSIS — M25511 Pain in right shoulder: Secondary | ICD-10-CM

## 2019-11-05 DIAGNOSIS — M6281 Muscle weakness (generalized): Secondary | ICD-10-CM

## 2019-11-05 DIAGNOSIS — R29898 Other symptoms and signs involving the musculoskeletal system: Secondary | ICD-10-CM | POA: Diagnosis not present

## 2019-11-05 DIAGNOSIS — G2589 Other specified extrapyramidal and movement disorders: Secondary | ICD-10-CM

## 2019-11-05 DIAGNOSIS — G8929 Other chronic pain: Secondary | ICD-10-CM

## 2019-11-05 NOTE — Patient Instructions (Addendum)
Neurovascular: Median Nerve Stretch - Supine    Lie with neck supported, side-bent away from moving arm. Tuck tip and turn head to the left. Hold right arm out to side, elbow bent, thumb down, fingers and wrist bent back. Slowly straighten elbow as far as possible without pain. Hold for _60___ seconds. Repeat ___2_ times per set. Do __2 times/day   All following exercises 5-10 sec hold 5-10 reps  Shoulder Blade Squeeze: Arms at Sides    Arms at sides, parallel, elbows straight, palms up. Press pelvis down. Squeeze backbone with shoulder blades, raising front of shoulders, chest, and arms. Keep head and neck neutral. Hold ___ seconds. Relax. Repeat ___ times.   Shoulder Blade Squeeze: Airplane    Arms out to sides at 90, elbows straight, palms down. Press pelvis down. Squeeze backbone with shoulder blades. Raise arms, front of shoulders, chest, and head. Keep neck neutral. Hold ___ seconds. Relax. Repeat ___ times.   Shoulder Blade Squeeze: W    Arms out to sides at 90 palms down. Bend elbows to 90. Press pelvis down. Squeeze backbone with shoulder blades. Raise arms, front of shoulders, chest, and head. Keep neck neutral. Hold ___ seconds. Relax. Repeat ___ times.   Shoulder Blade Squeeze: Superperson    Arms alongside head, elbows straight, palms down. Press pelvis down. Squeeze backbone with shoulder blades. Raise arms, chest, and head. Keep neck neutral. Hold ___ seconds. Relax. Repeat ___ times.

## 2019-11-05 NOTE — Therapy (Signed)
[facility] [place] [address] [place] [address] [place], [place], [address] Phone: [phone]   Fax:  [phone]  Physical Therapy Treatment  Patient Details  Name: Jane Mcdonald MRN: [id number] Date of Birth: 1956/01/02 Referring Provider (PT): Dr Clementeen Graham    Encounter Date: 11/05/2019   PT End of Session - 11/05/19 1105    Visit Number 3    Number of Visits 12    Date for PT Re-Evaluation 11/26/19    PT Start Time 1104    PT Stop Time 1152    PT Time Calculation (min) 48 min    Activity Tolerance Patient tolerated treatment well           Past Medical History:  Diagnosis Date  . ANXIETY, SITUATIONAL 07/14/2010  . FOOT PAIN, BILATERAL 03/30/2010  . Glaucoma    sees optho/Dr. Dione Booze every 6 months  . Glaucoma (increased eye pressure) 02/01/2012  . HYPERTENSION 07/14/2010  . Palpitations   . Renal artery stenosis, native, bilateral (HCC) 07/28/2010  . Rosacea   . SMOKER 07/14/2010    Past Surgical History:  Procedure Laterality Date  . DILATION AND CURETTAGE OF UTERUS     s/p  . OOPHORECTOMY     left only 2012, non malignant mass  . TUBAL LIGATION      There were no vitals filed for this visit.   Subjective Assessment - 11/05/19 1106    Subjective Rt shoulder is okay - working on exercises and using noodle at home. Notices that her Rt hand is numb some this week. Mostly in the thumb and first finger    Currently in Pain? No/denies    Pain Orientation Right    Pain Descriptors / Indicators Tightness;Numbness   tingling and numbness in the Rt hand   Pain Type Chronic pain              [facility] PT Assessment - 11/05/19 0001      Assessment   Medical Diagnosis Rt shoulder pain; Rt lateral epicondylitis     Referring Provider (PT) Dr Clementeen Graham     Onset Date/Surgical Date 10/06/17   gradually increasing in the past months   Hand Dominance Right    Next MD Visit PRN     Prior Therapy none       Sensation   Additional  Comments numbness and tingling in Rt hand - primarily thoumb and index finger       Posture/Postural Control   Posture Comments  less head forward; shoudlers rounded and elevated; head of the humerus anterior in orientation; scapulae abducted and rotated along the thoracic wall       Palpation   Palpation comment muscular tightness Rt > Lt pecs; upper trap; leveator; teres; ant/lat/post cervical musculature       Special Tests   Other special tests (+) neural tension test bilaat UE's Rt > Lt                          [facility] Adult PT Treatment/Exercise - 11/05/19 0001      Shoulder Exercises: Supine   Other Supine Exercises nerve stretch supine 1 min hold x 2 reps       Shoulder Exercises: Prone   Other Prone Exercises prone series arms at sides; W; airplane; superman 5 sec hold x 5 reps       Shoulder Exercises: ROM/Strengthening   UBE (Upper Arm Bike) L3 x 2 min fwd/2 min  back       Shoulder Exercises: Stretch   Other Shoulder Stretches doorway 30 sec x 30 sec x 2 each position       Moist Heat Therapy   Number Minutes Moist Heat 15 Minutes    Moist Heat Location Cervical;Shoulder      Electrical Stimulation   Electrical Stimulation Location Rt posterior shoulder girdle     Electrical Stimulation Action IFC    Electrical Stimulation Parameters to tolerance     Electrical Stimulation Goals Tone      Manual Therapy   Manual therapy comments pt supine     Joint Mobilization thoracic CPA mobs pt supine    Soft tissue mobilization deep tissue work Rt anterior shoulder through the pecs/deltoid/teres; clavicle and 1st rib junction(very tight) working on Rt upper quarter     Myofascial Release anterior chest     Passive ROM Rt shoulder flexion and ER     Manual Traction through Rt UE long arm traction 2-3 reps for 10-15 sec                   PT Education - 11/05/19 1131    Education Details HEP    Person(s) Educated Patient    Methods  Explanation;Demonstration;Tactile cues;Verbal cues;Handout    Comprehension Verbalized understanding;Returned demonstration;Verbal cues required;Tactile cues required               PT Long Term Goals - 10/15/19 1514      PT LONG TERM GOAL #1   Title Improve posture and alignment with posterior shoulder girdle engaged and increased upright posture    Time 6    Period Weeks    Status New    Target Date 11/26/19      PT LONG TERM GOAL #2   Title Increase Rt shoulder AROM to equal or greater that AROM Lt shoulder    Time 6    Period Weeks    Status New    Target Date 11/26/19      PT LONG TERM GOAL #3   Title Increase cervical ROM by 5-7 degrees in Lt lateral flexion and rotation    Time 6    Period Weeks    Status New    Target Date 11/26/19      PT LONG TERM GOAL #4   Title Independent in HEP    Time 6    Period Weeks    Status New    Target Date 11/26/19      PT LONG TERM GOAL #5   Title Improve FOTO to </= 33% limitation    Time 6    Period Weeks    Status New    Target Date 11/26/19                 Plan - 11/05/19 1109    Clinical Impression Statement Stability through shoulder improving. Some numbness and tingling reported in the Rt hand - primarily in the thumb and index/long fingers. 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 Positive neural tension test - addressed with nerve stretch; continued posterior shoudler girdle strengthening.    Rehab Potential Good    PT Frequency 1x / week    PT Duration 6 weeks    PT Treatment/Interventions Patient/family education;ADLs/Self Care Home Management;Cryotherapy;Electrical Stimulation;Iontophoresis 4mg /ml Dexamethasone;Moist Heat;Traction;Ultrasound;Therapeutic activities;Therapeutic exercise;Neuromuscular re-education;Manual techniques;Dry needling;Taping    PT Next Visit Plan review HEP; progress with posterior shoulder girdle strengthening and postural correction; manual work; through cervical  and upper  trap/pec musculature; modalities as indicated - shd flexion stretch on counter    PT Home Exercise Plan [email or web address]    Consulted and Agree with Plan of Care Patient           Patient will benefit from skilled therapeutic intervention in order to improve the following deficits and impairments:     Visit Diagnosis: Chronic right shoulder pain  Thoracic spine dysfunction  Other symptoms and signs involving the musculoskeletal system  Scapular dyskinesis  Muscle weakness (generalized)     Problem List Patient Active Problem List   Diagnosis Date Noted  . Right lateral epicondylitis 04/16/2018  . Chronic bursitis of right shoulder 04/16/2018  . Bicipital tendonitis of shoulder, right 03/19/2018  . Allergic rhinitis 03/19/2018  . Right flank pain 06/07/2016  . Chest pain 06/07/2016  . Hearing loss, left 03/10/2016  . Strain of flexor muscle of right hip 03/01/2015  . Right groin pain 02/18/2015  . Plantar fasciitis of right foot 02/16/2014  . Essential hypertension 02/15/2014  . Mass of left side of neck 05/20/2013  . Glaucoma (increased eye pressure) 02/01/2012  . Former smoker 02/01/2012  . Palpitations 08/04/2010  . Preventative health care 08/04/2010  . Right renal artery stenosis (HCC) 07/28/2010  . ANXIETY, SITUATIONAL 07/14/2010  . FOOT PAIN, BILATERAL 03/30/2010    Lindora Alviar Rober Minion PT, MPH  11/05/2019, 11:44 AM  [facility] [address] [place] [address] [place], [place], [address] Phone: [phone]   Fax:  [phone]  Name: Kyani Simkin MRN: [id number] Date of Birth: 09-19-1955

## 2019-11-14 ENCOUNTER — Other Ambulatory Visit: Payer: Self-pay

## 2019-11-14 ENCOUNTER — Encounter: Payer: Self-pay | Admitting: Rehabilitative and Restorative Service Providers"

## 2019-11-14 ENCOUNTER — Ambulatory Visit (INDEPENDENT_AMBULATORY_CARE_PROVIDER_SITE_OTHER): Payer: Federal, State, Local not specified - PPO | Admitting: Rehabilitative and Restorative Service Providers"

## 2019-11-14 DIAGNOSIS — M25511 Pain in right shoulder: Secondary | ICD-10-CM

## 2019-11-14 DIAGNOSIS — G2589 Other specified extrapyramidal and movement disorders: Secondary | ICD-10-CM

## 2019-11-14 DIAGNOSIS — G8929 Other chronic pain: Secondary | ICD-10-CM

## 2019-11-14 DIAGNOSIS — R29898 Other symptoms and signs involving the musculoskeletal system: Secondary | ICD-10-CM

## 2019-11-14 DIAGNOSIS — M5384 Other specified dorsopathies, thoracic region: Secondary | ICD-10-CM | POA: Diagnosis not present

## 2019-11-14 DIAGNOSIS — M6281 Muscle weakness (generalized): Secondary | ICD-10-CM

## 2019-11-14 NOTE — Therapy (Addendum)
[place], [place], [address] Phone: [phone]   Fax:  [phone]  Physical Therapy Treatment  Patient Details  Name: Jane Mcdonald MRN: [id number] Date of Birth: Mar 06, 1956 Referring Provider (PT): Dr Lynne Leader    Encounter Date: 11/14/2019   PT End of Session - 11/14/19 1409    Visit Number 4    Number of Visits 12    Date for PT Re-Evaluation 11/26/19    PT Start Time 6468    PT Stop Time 1451    PT Time Calculation (min) 49 min    Activity Tolerance Patient tolerated treatment well           Past Medical History:  Diagnosis Date  . ANXIETY, SITUATIONAL 07/14/2010  . FOOT PAIN, BILATERAL 03/30/2010  . Glaucoma    sees optho/Dr. Katy Fitch every 6 months  . Glaucoma (increased eye pressure) 02/01/2012  . HYPERTENSION 07/14/2010  . Palpitations   . Renal artery stenosis, native, bilateral ([place]) 07/28/2010  . Rosacea   . SMOKER 07/14/2010    Past Surgical History:  Procedure Laterality Date  . DILATION AND CURETTAGE OF UTERUS     s/p  . OOPHORECTOMY     left only 2012, non malignant mass  . TUBAL LIGATION      There were no vitals filed for this visit.   Subjective Assessment - 11/14/19 1412    Subjective Everything she has been given has helped - just needs to work on exercises more consistently. Pain in anterior Rt shoulder on and off in the last few days - no reason.    Currently in Pain? No/denies              [facility] PT Assessment - 11/14/19 0001      Assessment   Medical Diagnosis Rt shoulder pain; Rt lateral epicondylitis     Referring Provider (PT) Dr Lynne Leader     Onset Date/Surgical Date 10/06/17   gradually increasing in the past months   Hand Dominance Right    Next MD Visit PRN     Prior Therapy none       Observation/Other Assessments   Focus on Therapeutic Outcomes (FOTO)  37% limitation       Sensation   Additional Comments good resolution of numbness and  tingling bilat hands       Posture/Postural Control   Posture Comments improving upright posture and alignment       AROM   Right Shoulder Extension 66 Degrees    Right Shoulder Flexion 150 Degrees    Right Shoulder ABduction 162 Degrees    Right Shoulder External Rotation 90 Degrees    Left Shoulder Extension 52 Degrees    Left Shoulder Flexion 150 Degrees    Left Shoulder ABduction 150 Degrees    Left Shoulder External Rotation 94 Degrees      Palpation   Palpation comment decreasing muscular tightness Rt > Lt pecs; upper trap; leveator; teres; ant/lat/post cervical musculature                          [facility] Adult PT Treatment/Exercise - 11/14/19 0001      Shoulder Exercises: Standing   Extension Strengthening;Both;20 reps;Theraband    Theraband Level (Shoulder Extension) Level 4 (Blue)    Row Strengthening;Both;20 reps;Theraband    Theraband Level (Shoulder Row) Level 4 (Blue)    Row Limitations bow and arrow x 15 each side blue  TB     Other Standing Exercises lat pull TB over door x 10 blue TB       Shoulder Exercises: ROM/Strengthening   UBE (Upper Arm Bike) L3 x 2 min fwd/2 min back       Shoulder Exercises: Stretch   Other Shoulder Stretches doorway 30 sec x 30 sec x 2 each position       Moist Heat Therapy   Number Minutes Moist Heat 15 Minutes    Moist Heat Location Cervical;Shoulder      Electrical Stimulation   Electrical Stimulation Location Rt posterior shoulder girdle     Electrical Stimulation Action IFC    Electrical Stimulation Parameters to tolerance    Electrical Stimulation Goals Tone      Manual Therapy   Manual therapy comments pt supine     Joint Mobilization thoracic CPA mobs pt supine    Soft tissue mobilization deep tissue work Rt anterior shoulder through the pecs/deltoid/teres; clavicle and 1st rib junction(very tight) working on Rt upper quarter     Myofascial Release anterior chest     Passive ROM Rt shoulder flexion and  ER     Manual Traction through Rt UE long arm traction 2-3 reps for 10-15 sec                   PT Education - 11/14/19 1424    Education Details HEP    Person(s) Educated Patient    Methods Explanation;Demonstration;Tactile cues;Verbal cues;Handout    Comprehension Verbalized understanding;Returned demonstration;Verbal cues required;Tactile cues required               PT Long Term Goals - 11/14/19 1453      PT LONG TERM GOAL #1   Title Improve posture and alignment with posterior shoulder girdle engaged and increased upright posture    Time 6    Period Weeks    Status Achieved      PT LONG TERM GOAL #2   Title Increase Rt shoulder AROM to equal or greater that AROM Lt shoulder    Time 6    Period Weeks    Status Achieved      PT LONG TERM GOAL #3   Title Increase cervical ROM by 5-7 degrees in Lt lateral flexion and rotation    Time 6    Period Weeks    Status Achieved      PT LONG TERM GOAL #4   Title Independent in HEP    Time 6    Period Weeks    Status Achieved      PT LONG TERM GOAL #5   Title Improve FOTO to </= 33% limitation    Time 6    Period Weeks    Status Partially Met                 Plan - 11/14/19 1413    Clinical Impression Statement Continued intermittent symptoms in the anterior and superior Rt shoulder. Patient demonstrates improving posture and alignment; increasing tissue extensibility. She is adding strengthening and stabilization exercises in clinic. Needs to work more consistently on HEP.    Rehab Potential Good    PT Frequency 1x / week    PT Duration 6 weeks    PT Treatment/Interventions Patient/family education;ADLs/Self Care Home Management;Cryotherapy;Electrical Stimulation;Iontophoresis 4m/ml Dexamethasone;Moist Heat;Traction;Ultrasound;Therapeutic activities;Therapeutic exercise;Neuromuscular re-education;Manual techniques;Dry needling;Taping    PT Next Visit Plan hold PT - patient will continue with  independent HEP and call to schedule appointments as needed  PT Home Exercise Plan [email or web address]    Consulted and Agree with Plan of Care Patient           Patient will benefit from skilled therapeutic intervention in order to improve the following deficits and impairments:     Visit Diagnosis: Chronic right shoulder pain  Thoracic spine dysfunction  Other symptoms and signs involving the musculoskeletal system  Scapular dyskinesis  Muscle weakness (generalized)     Problem List Patient Active Problem List   Diagnosis Date Noted  . Right lateral epicondylitis 04/16/2018  . Chronic bursitis of right shoulder 04/16/2018  . Bicipital tendonitis of shoulder, right 03/19/2018  . Allergic rhinitis 03/19/2018  . Right flank pain 06/07/2016  . Chest pain 06/07/2016  . Hearing loss, left 03/10/2016  . Strain of flexor muscle of right hip 03/01/2015  . Right groin pain 02/18/2015  . Plantar fasciitis of right foot 02/16/2014  . Essential hypertension 02/15/2014  . Mass of left side of neck 05/20/2013  . Glaucoma (increased eye pressure) 02/01/2012  . Former smoker 02/01/2012  . Palpitations 08/04/2010  . Preventative health care 08/04/2010  . Right renal artery stenosis ([place]) 07/28/2010  . ANXIETY, SITUATIONAL 07/14/2010  . FOOT PAIN, BILATERAL 03/30/2010    Krystena Reitter Nilda Simmer PT, MPH  11/14/2019, 2:54 PM  [facility] [place], [place], [address] Phone: [phone]   Fax:  [phone]  Name: Eladia Frame MRN: [id number] Date of Birth: Nov 13, 1955  PHYSICAL THERAPY DISCHARGE SUMMARY  Visits from Start of Care: 4  Current functional level related to goals / functional outcomes: See last progress note for discharge status   Remaining deficits: Intermittent symptoms  Needs to work on [employer] consistently    Education / Equipment: HEP  Plan: Patient agrees to discharge.  Patient goals were partially met.  Patient is being discharged due to being pleased with the current functional level.  ?????    Caidin Heidenreich P. Helene Kelp PT, MPH 11/20/19 3:09 PM

## 2019-11-14 NOTE — Patient Instructions (Signed)
Access Code: [email or web address]: [email or web address]: 07/09/2021Prepared by: Ingvald Theisen HoltExercises  Seated Cervical Retraction - 2 x daily - 7 x weekly - 1 sets - 3 reps - 10sec hold  Seated Scapular Retraction - 2 x daily - 7 x weekly - 1 sets - 3 reps - 10 sec hold  Shoulder External Rotation and Scapular Retraction - 2 x daily - 7 x weekly - 1 sets - 10 reps - 1-2 sec hold  Shoulder External Rotation in 45 Degrees Abduction - 2 x daily - 7 x weekly - 1 sets - 10 reps - 1-2 sec hold  Doorway Pec Stretch at 60 Degrees Abduction - 3 x daily - 7 x weekly - 3 reps - 1 sets  Doorway Pec Stretch at 90 Degrees Abduction - 3 x daily - 7 x weekly - 3 reps - 1 sets - 30 seconds hold  Doorway Pec Stretch at 120 Degrees Abduction - 3 x daily - 7 x weekly - 3 reps - 1 sets - 30 second hold hold  Hooklying Shoulder T - 2 x daily - 7 x weekly - 1 sets - 1 reps - 2-5 min hold  Shoulder External Rotation and Scapular Retraction with Resistance - 2 x daily - 7 x weekly - 1-2 sets - 10 reps - 3 sec hold  Scapular Retraction with Resistance - 2 x daily - 7 x weekly - 1-2 sets - 10 reps - 3 sec hold  Scapular Retraction with Resistance Advanced - 2 x daily - 7 x weekly - 1-2 sets - 10 reps - 3 sec hold  Shoulder ER Stretch in Abduction - 2 x daily - 7 x weekly - 1 sets - 3 reps - 30 sec hold  Standing 'L' Stretch at Counter - 2 x daily - 7 x weekly - 1 sets - 3 reps - 20-30 sec hold  Standing Lat Pull Down with Resistance - Elbows Bent - 2 x daily - 7 x weekly - 1 sets - 10 reps - 3 sec hold

## 2020-03-01 DIAGNOSIS — Z124 Encounter for screening for malignant neoplasm of cervix: Secondary | ICD-10-CM | POA: Diagnosis not present

## 2020-03-01 DIAGNOSIS — Z01419 Encounter for gynecological examination (general) (routine) without abnormal findings: Secondary | ICD-10-CM | POA: Diagnosis not present

## 2020-03-01 DIAGNOSIS — Z1151 Encounter for screening for human papillomavirus (HPV): Secondary | ICD-10-CM | POA: Diagnosis not present

## 2020-03-01 DIAGNOSIS — Z6823 Body mass index (BMI) 23.0-23.9, adult: Secondary | ICD-10-CM | POA: Diagnosis not present

## 2020-03-04 ENCOUNTER — Encounter: Payer: Self-pay | Admitting: Sports Medicine

## 2020-03-04 ENCOUNTER — Ambulatory Visit: Payer: Federal, State, Local not specified - PPO | Admitting: Sports Medicine

## 2020-03-04 ENCOUNTER — Other Ambulatory Visit: Payer: Self-pay

## 2020-03-04 VITALS — BP 118/76 | Ht 65.0 in | Wt 142.0 lb

## 2020-03-04 DIAGNOSIS — M7741 Metatarsalgia, right foot: Secondary | ICD-10-CM | POA: Diagnosis not present

## 2020-03-04 NOTE — Progress Notes (Signed)
   PCP: Corwin Levins, MD  Subjective:   HPI: Patient is a 64 y.o. female with history of plantar fasciitis here for evaluation of right foot pain.  Patient reports that 2 to 3 months ago, she started having pain on the ball of her foot.  She does not recall any specific injury or trauma, rather is insidious in onset.  She spends a lot of time standing on concrete floors in her job at [employer].  The pain is on the plantar aspect of her foot, around her second metatarsal.  It is worse with prolonged weightbearing.  She has had some mild relief with anti-inflammatories and altering her gait, however she is concerned about compensatory injuries if she is altering her gait.  She is not able to walk on hard surfaces without shoes due to the pain.   Review of Systems:  Per HPI.   PMFSH, medications and smoking status reviewed.      Objective:  Physical Exam:  [facility] Adult [facility] 03/04/2020  Frequency of aerobic exercise (# of days/week) 0  Average time in minutes 0  Frequency of strengthening activities (# of days/week) 0     Gen: awake, alert, NAD, comfortable in exam room Pulm: breathing unlabored  R Foot: Inspection: There is a bunion of her right great toe.  No other obvious bony deformity.  No swelling, erythema, or bruising.  Fairly preserved longitudinal arch.  She does have some collapse of the transverse arch, however no significant callus over the second metatarsal head. Palpation: She does have tenderness to palpation over the second metatarsal head.  Nontender between metatarsal heads. ROM: Full  ROM of the ankle. Normal midfoot flexibility Strength: 5/5 strength ankle in all planes Neurovascular: N/V intact distally in the lower extremity Special tests: Negative anterior drawer. Negative squeeze, negative Mulder's click. normal midfoot flexibility. Normal calcaneal motion with heel raise    Assessment & Plan:  1.  Right second metatarsal  metatarsalgia  Patient with metatarsalgia of the right second metatarsal head.  She already has orthotics and has had a metatarsal pad placed more laterally.  We discussed that metatarsal pad is usually helpful for this, and her previous metatarsal pad was removed and replaced with a metatarsal pad in the appropriate anatomic position to support the second metatarsal head.  Patient trialed the new configuration in her shoes, reported that it seemed to relieve her pain but she was unsure about how it made her lateral foot feel.  We will plan to trial this alteration for the next few weeks, she knows to return if the location of this pad produces discomfort.  Guy Sandifer, MD [facility] Fellow 03/04/2020 2:13 PM   Patient seen and evaluated with the sports medicine fellow.  I agree with the above plan of care.  Going to replace her existing metatarsal pad with a new one.  No restrictions on activity.  Follow-up as needed.

## 2020-03-11 DIAGNOSIS — H40053 Ocular hypertension, bilateral: Secondary | ICD-10-CM | POA: Diagnosis not present

## 2020-03-11 DIAGNOSIS — H2513 Age-related nuclear cataract, bilateral: Secondary | ICD-10-CM | POA: Diagnosis not present

## 2020-03-23 ENCOUNTER — Other Ambulatory Visit (INDEPENDENT_AMBULATORY_CARE_PROVIDER_SITE_OTHER): Payer: Federal, State, Local not specified - PPO

## 2020-03-23 DIAGNOSIS — E559 Vitamin D deficiency, unspecified: Secondary | ICD-10-CM

## 2020-03-23 DIAGNOSIS — R739 Hyperglycemia, unspecified: Secondary | ICD-10-CM | POA: Diagnosis not present

## 2020-03-23 DIAGNOSIS — E611 Iron deficiency: Secondary | ICD-10-CM

## 2020-03-23 DIAGNOSIS — E538 Deficiency of other specified B group vitamins: Secondary | ICD-10-CM | POA: Diagnosis not present

## 2020-03-23 DIAGNOSIS — Z0000 Encounter for general adult medical examination without abnormal findings: Secondary | ICD-10-CM

## 2020-03-23 LAB — URINALYSIS, ROUTINE W REFLEX MICROSCOPIC
Bilirubin Urine: NEGATIVE
Ketones, ur: NEGATIVE
Nitrite: NEGATIVE
Specific Gravity, Urine: 1.025 (ref 1.000–1.030)
Total Protein, Urine: NEGATIVE
Urine Glucose: NEGATIVE
Urobilinogen, UA: 0.2 (ref 0.0–1.0)
pH: 6.5 (ref 5.0–8.0)

## 2020-03-23 LAB — BASIC METABOLIC PANEL
BUN: 14 mg/dL (ref 6–23)
CO2: 30 mEq/L (ref 19–32)
Calcium: 9.2 mg/dL (ref 8.4–10.5)
Chloride: 102 mEq/L (ref 96–112)
Creatinine, Ser: 0.76 mg/dL (ref 0.40–1.20)
GFR: 84.1 mL/min (ref >60.00)
Glucose, Bld: 95 mg/dL (ref 70–99)
Potassium: 4.2 mEq/L (ref 3.5–5.1)
Sodium: 139 mEq/L (ref 135–145)

## 2020-03-23 LAB — CBC WITH DIFFERENTIAL/PLATELET
Basophils Absolute: 0 10*3/uL (ref 0.0–0.1)
Basophils Relative: 0.5 % (ref 0.0–3.0)
Eosinophils Absolute: 0.1 10*3/uL (ref 0.0–0.7)
Eosinophils Relative: 2 % (ref 0.0–5.0)
HCT: 39.7 % (ref 36.0–46.0)
Hemoglobin: 13.2 g/dL (ref 12.0–15.0)
Lymphocytes Relative: 23.2 % (ref 12.0–46.0)
Lymphs Abs: 1.5 10*3/uL (ref 0.7–4.0)
MCHC: 33.4 g/dL (ref 30.0–36.0)
MCV: 88.9 fl (ref 78.0–100.0)
Monocytes Absolute: 0.5 10*3/uL (ref 0.1–1.0)
Monocytes Relative: 8.5 % (ref 3.0–12.0)
Neutro Abs: 4.2 10*3/uL (ref 1.4–7.7)
Neutrophils Relative %: 65.8 % (ref 43.0–77.0)
Platelets: 283 10*3/uL (ref 150.0–400.0)
RBC: 4.47 Mil/uL (ref 3.87–5.11)
RDW: 13.5 % (ref 11.5–15.5)
WBC: 6.4 10*3/uL (ref 4.0–10.5)

## 2020-03-23 LAB — HEPATIC FUNCTION PANEL
ALT: 13 U/L (ref 0–35)
AST: 17 U/L (ref 0–37)
Albumin: 4.4 g/dL (ref 3.5–5.2)
Alkaline Phosphatase: 46 U/L (ref 39–117)
Bilirubin, Direct: 0.1 mg/dL (ref 0.0–0.3)
Total Bilirubin: 0.6 mg/dL (ref 0.2–1.2)
Total Protein: 7.1 g/dL (ref 6.0–8.3)

## 2020-03-23 LAB — IBC PANEL
Iron: 130 ug/dL (ref 42–145)
Saturation Ratios: 38.4 % (ref 20.0–50.0)
Transferrin: 242 mg/dL (ref 212.0–360.0)

## 2020-03-23 LAB — LIPID PANEL
Cholesterol: 155 mg/dL (ref 0–200)
HDL: 68.8 mg/dL (ref >39.00)
LDL Cholesterol: 56 mg/dL (ref 0–99)
NonHDL: 86.09
Total CHOL/HDL Ratio: 2
Triglycerides: 149 mg/dL (ref 0.0–149.0)
VLDL: 29.8 mg/dL (ref 0.0–40.0)

## 2020-03-23 LAB — VITAMIN B12: Vitamin B-12: 474 pg/mL (ref 211–911)

## 2020-03-23 LAB — TSH: TSH: 1.23 u[IU]/mL (ref 0.35–4.50)

## 2020-03-23 LAB — HEMOGLOBIN A1C: Hgb A1c MFr Bld: 5.6 % (ref 4.6–6.5)

## 2020-03-23 LAB — VITAMIN D 25 HYDROXY (VIT D DEFICIENCY, FRACTURES): VITD: 58.61 ng/mL (ref 30.00–100.00)

## 2020-03-30 ENCOUNTER — Encounter: Payer: Self-pay | Admitting: Internal Medicine

## 2020-03-30 ENCOUNTER — Ambulatory Visit (INDEPENDENT_AMBULATORY_CARE_PROVIDER_SITE_OTHER): Payer: Federal, State, Local not specified - PPO | Admitting: Internal Medicine

## 2020-03-30 ENCOUNTER — Other Ambulatory Visit: Payer: Self-pay

## 2020-03-30 VITALS — BP 140/60 | HR 79 | Temp 98.6°F | Ht 65.0 in | Wt 140.0 lb

## 2020-03-30 DIAGNOSIS — F419 Anxiety disorder, unspecified: Secondary | ICD-10-CM

## 2020-03-30 DIAGNOSIS — I10 Essential (primary) hypertension: Secondary | ICD-10-CM | POA: Diagnosis not present

## 2020-03-30 DIAGNOSIS — Z0000 Encounter for general adult medical examination without abnormal findings: Secondary | ICD-10-CM

## 2020-03-30 DIAGNOSIS — Z0001 Encounter for general adult medical examination with abnormal findings: Secondary | ICD-10-CM

## 2020-03-30 DIAGNOSIS — H9311 Tinnitus, right ear: Secondary | ICD-10-CM | POA: Diagnosis not present

## 2020-03-30 DIAGNOSIS — R87619 Unspecified abnormal cytological findings in specimens from cervix uteri: Secondary | ICD-10-CM | POA: Insufficient documentation

## 2020-03-30 DIAGNOSIS — H9319 Tinnitus, unspecified ear: Secondary | ICD-10-CM | POA: Insufficient documentation

## 2020-03-30 DIAGNOSIS — J309 Allergic rhinitis, unspecified: Secondary | ICD-10-CM | POA: Diagnosis not present

## 2020-03-30 DIAGNOSIS — H409 Unspecified glaucoma: Secondary | ICD-10-CM | POA: Diagnosis not present

## 2020-03-30 DIAGNOSIS — N751 Abscess of Bartholin's gland: Secondary | ICD-10-CM | POA: Insufficient documentation

## 2020-03-30 NOTE — Patient Instructions (Signed)
Please continue all other medications as before, and refills have been done if requested.  Please have the pharmacy call with any other refills you may need.  Please continue your efforts at being more active, low cholesterol diet, and weight control.  You are otherwise up to date with prevention measures today.  Please keep your appointments with your specialists as you may have planned  You will be contacted regarding the referral for: MRI if ok with your insurance  Please check your BP on a regular basis, with the goal to be less than 130/80  Please make an Appointment to return for your 1 year visit, or sooner if needed, with Lab testing by Appointment as well, to be done about 3-5 days before at the FIRST FLOOR Lab (so this is for TWO appointments - please see the scheduling desk as you leave)  Due to the ongoing Covid 19 pandemic, our lab now requires an appointment for any labs done at our office.  If you need labs done and do not have an appointment, please call our office ahead of time to schedule before presenting to the lab for your testing.

## 2020-03-30 NOTE — Progress Notes (Addendum)
Subjective:    Patient ID: Jane Mcdonald, female    DOB: 04/04/1956, 64 y.o.   MRN: [id number]  HPI  Here for wellness and f/u;  Overall doing ok;  Pt denies Chest pain, worsening SOB, DOE, wheezing, orthopnea, PND, worsening LE edema, palpitations, dizziness or syncope.    Pt denies polydipsia, polyuria, or low sugar symptoms. Pt states overall good compliance with treatment and medications, good tolerability, and has been trying to follow appropriate diet.  Pt denies worsening depressive symptoms, suicidal ideation or panic. No fever, night sweats, wt loss, loss of appetite, or other constitutional symptoms.  Pt states good ability with ADL's, has low fall risk, home safety reviewed and adequate, no other significant changes in hearing or vision, and only occasionally active with exercise Wt Readings from Last 3 Encounters:  03/30/20 140 lb (63.5 kg)  03/04/20 142 lb (64.4 kg)  10/01/19 137 lb 12.8 oz (62.5 kg)  Also with c/o new onset tinnitus right ear with possible pulsations, and dizziness for 6 mo, Pt denies new neurological symptoms such as new headache, or facial or extremity weakness or numbness   Past Medical History:  Diagnosis Date  . ANXIETY, SITUATIONAL 07/14/2010  . FOOT PAIN, BILATERAL 03/30/2010  . Glaucoma    sees optho/Dr. Dione Booze every 6 months  . Glaucoma (increased eye pressure) 02/01/2012  . HYPERTENSION 07/14/2010  . Palpitations   . Renal artery stenosis, native, bilateral (HCC) 07/28/2010  . Rosacea   . SMOKER 07/14/2010   Past Surgical History:  Procedure Laterality Date  . DILATION AND CURETTAGE OF UTERUS     s/p  . OOPHORECTOMY     left only 2012, non malignant mass  . TUBAL LIGATION      reports that she has never smoked. She has never used smokeless tobacco. She reports current alcohol use. She reports that she does not use drugs. family history includes Cancer in an other family member; Heart disease in her father; Hypertension in her mother; Nephrolithiasis  in her father. No Known Allergies Current Outpatient Medications on File Prior to Visit  Medication Sig Dispense Refill  . aspirin 81 MG EC tablet Take 1 tablet (81 mg total) by mouth daily. Swallow whole. 30 tablet 12  . diclofenac sodium (VOLTAREN) 1 % GEL Apply 2 g topically 4 (four) times daily. 100 g 5  . latanoprost (XALATAN) 0.005 % ophthalmic solution 1 drop. Use as directed    . lisinopril (ZESTRIL) 20 MG tablet Take 1 tablet (20 mg total) by mouth daily. 90 tablet 3  . Multiple Vitamins-Minerals (VITAMIN D3 COMPLETE PO) Take by mouth.    . triamcinolone (NASACORT) 55 MCG/ACT AERO nasal inhaler Place 2 sprays into the nose daily. 1 Inhaler 12   No current facility-administered medications on file prior to visit.   Review of Systems All otherwise neg per pt    Objective:   Physical Exam BP 140/60 (BP Location: Left Arm, Patient Position: Sitting, Cuff Size: Large)   Pulse 79   Temp 98.6 F (37 C) (Oral)   Ht 5\' 5"  (1.651 m)   Wt 140 lb (63.5 kg)   SpO2 98%   BMI 23.30 kg/m  VS noted,  Constitutional: Pt appears in NAD HENT: Head: NCAT.  Right Ear: External ear normal.  Left Ear: External ear normal.  Eyes: . Pupils are equal, round, and reactive to light. Conjunctivae and EOM are normal Nose: without d/c or deformity Neck: Neck supple. Gross normal ROM Cardiovascular: Normal rate and  regular rhythm.   Pulmonary/Chest: Effort normal and breath sounds without rales or wheezing.  Abd:  Soft, NT, ND, + BS, no organomegaly Neurological: Pt is alert. At baseline orientation, motor grossly intact Skin: Skin is warm. No rashes, other new lesions, no LE edema Psychiatric: Pt behavior is normal without agitation  All otherwise neg per pt Lab Results  Component Value Date   WBC 6.4 03/23/2020   HGB 13.2 03/23/2020   HCT 39.7 03/23/2020   PLT 283.0 03/23/2020   GLUCOSE 95 03/23/2020   CHOL 155 03/23/2020   TRIG 149.0 03/23/2020   HDL 68.80 03/23/2020   LDLCALC 56  03/23/2020   ALT 13 03/23/2020   AST 17 03/23/2020   NA 139 03/23/2020   K 4.2 03/23/2020   CL 102 03/23/2020   CREATININE 0.76 03/23/2020   BUN 14 03/23/2020   CO2 30 03/23/2020   TSH 1.23 03/23/2020   HGBA1C 5.6 03/23/2020   MICROALBUR <0.7 03/03/2016       Assessment & Plan:

## 2020-04-04 ENCOUNTER — Encounter: Payer: Self-pay | Admitting: Internal Medicine

## 2020-04-04 NOTE — Assessment & Plan Note (Signed)
Uncontrolled here, for f/u BP at home daily and call with 10 day average

## 2020-04-04 NOTE — Assessment & Plan Note (Signed)

## 2020-04-04 NOTE — Assessment & Plan Note (Addendum)
Recent onset, may be pulsatile at times, for MRI head  I spent 31 minutes in preparing to see the patient by review of recent labs, imaging and procedures, obtaining and reviewing separately obtained history, communicating with the patient and family or caregiver, ordering medications, tests or procedures, and documenting clinical information in the EHR including the differential Dx, treatment, and any further evaluation and other management of tinnitus, htn, glaucoma, anxiety, allergies

## 2020-04-04 NOTE — Assessment & Plan Note (Signed)
Encouraged to f/u optho as planned

## 2020-04-04 NOTE — Assessment & Plan Note (Signed)
Mild, improved, cont antihistamine use

## 2020-04-04 NOTE — Assessment & Plan Note (Signed)
stable overall by history and exam, recent data reviewed with pt, and pt to continue medical treatment as before,  to f/u any worsening symptoms or concerns  

## 2020-04-22 ENCOUNTER — Ambulatory Visit
Admission: RE | Admit: 2020-04-22 | Discharge: 2020-04-22 | Disposition: A | Discharge status: Home / Self Care | Payer: Federal, State, Local not specified - PPO | Source: Ambulatory Visit | Attending: Internal Medicine | Admitting: Internal Medicine

## 2020-04-22 ENCOUNTER — Encounter: Payer: Self-pay | Admitting: Internal Medicine

## 2020-04-22 DIAGNOSIS — J341 Cyst and mucocele of nose and nasal sinus: Secondary | ICD-10-CM | POA: Diagnosis not present

## 2020-04-22 DIAGNOSIS — R42 Dizziness and giddiness: Secondary | ICD-10-CM | POA: Diagnosis not present

## 2020-04-22 DIAGNOSIS — H9190 Unspecified hearing loss, unspecified ear: Secondary | ICD-10-CM | POA: Diagnosis not present

## 2020-04-22 DIAGNOSIS — H9311 Tinnitus, right ear: Secondary | ICD-10-CM

## 2020-04-22 DIAGNOSIS — H9319 Tinnitus, unspecified ear: Secondary | ICD-10-CM | POA: Diagnosis not present

## 2020-05-05 DIAGNOSIS — L300 Nummular dermatitis: Secondary | ICD-10-CM | POA: Diagnosis not present

## 2020-05-05 DIAGNOSIS — L821 Other seborrheic keratosis: Secondary | ICD-10-CM | POA: Diagnosis not present

## 2020-05-05 DIAGNOSIS — D485 Neoplasm of uncertain behavior of skin: Secondary | ICD-10-CM | POA: Diagnosis not present

## 2020-05-19 ENCOUNTER — Telehealth: Payer: Self-pay | Admitting: Internal Medicine

## 2020-05-19 NOTE — Telephone Encounter (Signed)
Patient called and said she had a MRI done a few weeks ago and was wondering if Dr. Jonny Ruiz could write her a note to be out of work for a few weeks to see if the ringing in her ears will subside. Please call her back at [phone].

## 2020-05-19 NOTE — Telephone Encounter (Signed)
Very sorry, but as tinnitis is sometimes permanent, and work is not likely the cause, I cannot really see a work note for this

## 2020-05-25 ENCOUNTER — Other Ambulatory Visit: Payer: Self-pay | Admitting: Internal Medicine

## 2020-07-14 NOTE — Progress Notes (Signed)
I, Jane Mcdonald, LAT, ATC acting as a scribe for Jane Graham, MD.  Jane Mcdonald is a 65 y.o. female who presents to [facility] today for continued chronic R shoulder pain. 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 Pt was last seen by Dr. Denyse Amass on 10/01/19 and was provided w/ HEP, advised to reduce work days, and was referred to PT of which she's completed 4 visits. Today, pt reports shoulder gets irritates quicker/easier. Pt locates R shoulder pain to superior and anterior aspect. Pt describes pain as constant. Pt c/o of pain radiating numbness down to index finger and thumb.   Neck pain: no- but "cracks" Radiates: yes-  R shoulder mechanical symptoms: No UE Numbness/tingling: yes- numbness UE Weakness: yes Aggravates: work, working outdoors, driving Treatments tried:Voltaren gel, HEP, Tylenol arthritis  Dx imaging: 10/01/19 R shoulder XR  Pertinent review of systems: No fevers or chills  Relevant historical information: Hypertension.  Renal artery stenosis.   Exam:  BP 124/76   Pulse 75   Ht 5\' 5"  (1.651 m)   Wt 147 lb (66.7 kg)   SpO2 100%   BMI 24.46 kg/m  General: Well Developed, well nourished, and in no acute distress.   MSK: C-spine normal-appearing Nontender midline. Normal cervical motion. Negative Spurling's test. Extremity strength is intact and equal throughout. Reflexes are equal normal throughout bilateral extremities.  Right shoulder normal-appearing Nontender. Normal shoulder motion. Intact strength. Minimally positive Hawkins and Neer's test. Minimally positive crossover arm compression test.. Negative empty can test Negative Yergason's and speeds test.  Right wrist normal-appearing nontender normal motion negative Tinel's and Phalen's test.   Lab and Radiology Results   EXAM: RIGHT SHOULDER - 2+ VIEW  COMPARISON:  None.  FINDINGS: Moderate  AC joint degenerative change.  No fracture or malalignment.  IMPRESSION: AC joint degenerative change   Electronically Signed   By: M.D.   On: 10/01/2019 22:07  I, 10/03/2019, personally (independently) visualized and performed the interpretation of the images attached in this note.     Assessment and Plan: 65 y.o. female with right shoulder pain.  Patient is thought to have rotator cuff tendinopathy and AC DJD as main source of pain.  Plan for continued conservative management with repeat course of physical therapy after discussion with patient.  Offered steroid injection as a potential option and patient declined.  Recheck in 2 months.  Next step would be either steroid injection or MRI.  Patient does have some paresthesias into her right hand at her thumb and index finger which could be cervical radiculopathy or potential carpal tunnel syndrome.  Physical therapy will work on her neck as well this further clarify potential sources of cervical radiculopathy.  Work this up further if needed in the future follow-up in 2 months.   PDMP not reviewed this encounter. Orders Placed This Encounter  Procedures  . Ambulatory referral to Physical Therapy    Referral Priority:   Routine    Referral Type:   Physical Medicine    Referral Reason:   Specialty Services Required    Requested Specialty:   Physical Therapy   No orders of the defined types were placed in this encounter.    Discussed warning signs or symptoms. Please see discharge instructions. Patient expresses understanding.   The above documentation has been reviewed and is accurate and complete 70, M.D.

## 2020-07-15 ENCOUNTER — Ambulatory Visit: Payer: Federal, State, Local not specified - PPO | Admitting: Family Medicine

## 2020-07-15 ENCOUNTER — Ambulatory Visit: Payer: Self-pay

## 2020-07-15 ENCOUNTER — Other Ambulatory Visit: Payer: Self-pay

## 2020-07-15 VITALS — BP 124/76 | HR 75 | Ht 65.0 in | Wt 147.0 lb

## 2020-07-15 DIAGNOSIS — M25511 Pain in right shoulder: Secondary | ICD-10-CM

## 2020-07-15 DIAGNOSIS — G8929 Other chronic pain: Secondary | ICD-10-CM | POA: Diagnosis not present

## 2020-07-15 DIAGNOSIS — M542 Cervicalgia: Secondary | ICD-10-CM | POA: Diagnosis not present

## 2020-07-15 NOTE — Patient Instructions (Addendum)
Thank you for coming in today.  Continue home exercises.  I've referred you to Physical Therapy.  Let us know if you don't hear from them in one week.  Recheck in 2 months.   We can do more including injection or MRI.

## 2020-07-27 DIAGNOSIS — L570 Actinic keratosis: Secondary | ICD-10-CM | POA: Diagnosis not present

## 2020-07-27 DIAGNOSIS — L821 Other seborrheic keratosis: Secondary | ICD-10-CM | POA: Diagnosis not present

## 2020-07-27 DIAGNOSIS — Z85828 Personal history of other malignant neoplasm of skin: Secondary | ICD-10-CM | POA: Diagnosis not present

## 2020-07-27 DIAGNOSIS — D225 Melanocytic nevi of trunk: Secondary | ICD-10-CM | POA: Diagnosis not present

## 2020-07-27 DIAGNOSIS — L814 Other melanin hyperpigmentation: Secondary | ICD-10-CM | POA: Diagnosis not present

## 2020-08-26 DIAGNOSIS — Z1231 Encounter for screening mammogram for malignant neoplasm of breast: Secondary | ICD-10-CM | POA: Diagnosis not present

## 2021-03-15 DIAGNOSIS — H40053 Ocular hypertension, bilateral: Secondary | ICD-10-CM | POA: Diagnosis not present

## 2021-03-15 DIAGNOSIS — H2513 Age-related nuclear cataract, bilateral: Secondary | ICD-10-CM | POA: Diagnosis not present

## 2021-03-26 IMAGING — MR MR HEAD W/O CM
9 of 10 series · 43 of 48 positions shown · non-contrast
Comparison: None.

CLINICAL DATA: Tinnitus, hearing loss, and dizziness.

EXAM:
MRI HEAD WITHOUT CONTRAST
TECHNIQUE: Multiplanar, multiecho pulse sequences of the brain and surrounding
structures were obtained without intravenous contrast.

[Series 5: T1 · sagittal · 4.0mm · 0.75mm/px · 1 of 28 slices shown (1 of 2)]
[im 1/28]
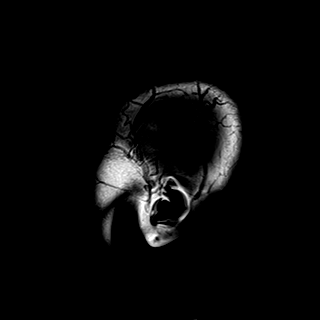

[Series 6: DWI · axial · 3.0mm · 0.94mm/px · z∈[-56,+84]mm · 11 of 160 slices shown]
[im 1/160]
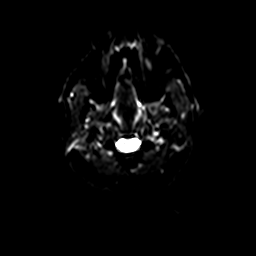
[im 16/160]
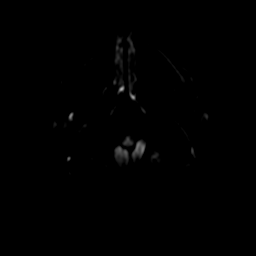
[im 32/160]
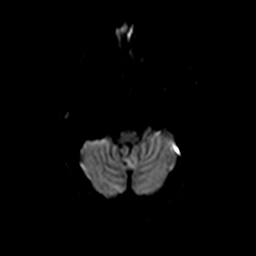
[im 48/160]
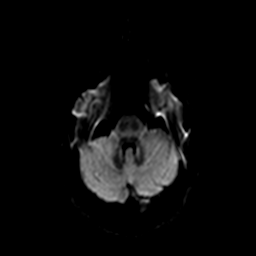
[im 64/160]
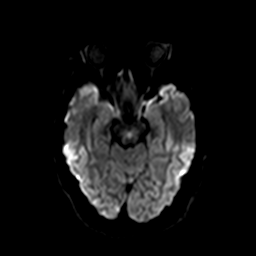
[im 80/160]
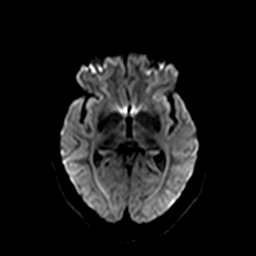
[im 96/160]
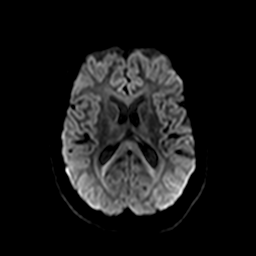
[im 112/160]
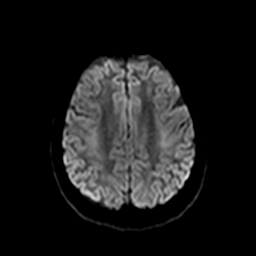
[im 128/160]
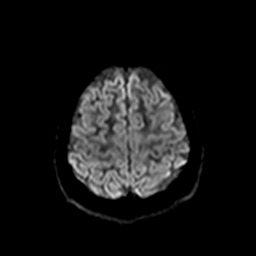
[im 144/160]
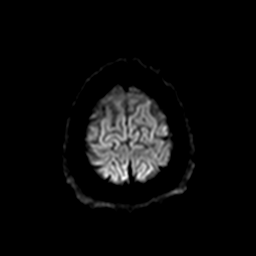
[im 160/160]
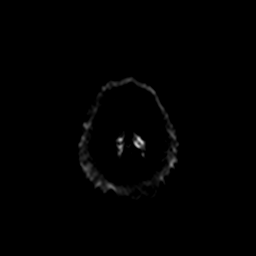

[Series 7: ax dwi_tracew · axial · 3.0mm · 0.94mm/px · z∈[-56,+84]mm · 6 of 80 slices shown]
[im 1/80]
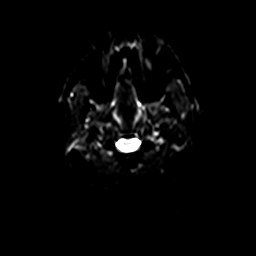
[im 16/80]
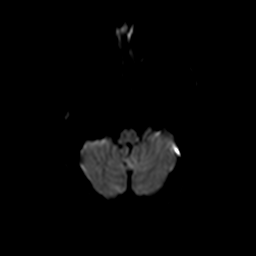
[im 32/80]
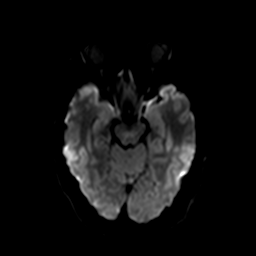
[im 48/80]
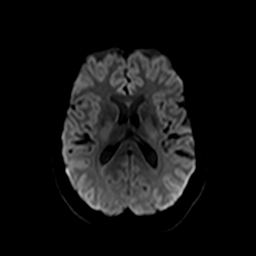
[im 64/80]
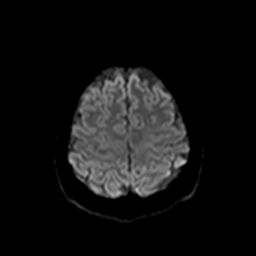
[im 80/80]
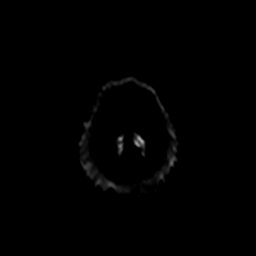

[Series 8: ax dwi_adc · axial · 3.0mm · 0.94mm/px · z∈[-56,+84]mm · 3 of 40 slices shown]
[im 1/40]
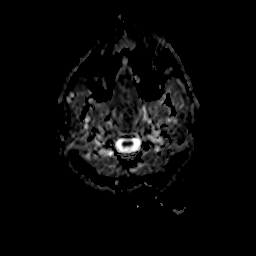
[im 20/40]
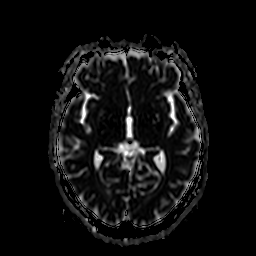
[im 40/40]
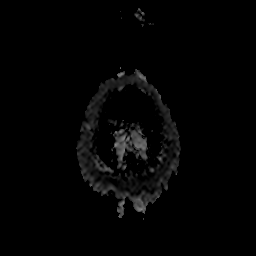

[Series 9: T2 · axial · 4.0mm · 0.36mm/px · z∈[-53,+82]mm · 2 of 27 slices shown (1 of 2)]
[im 1/27]
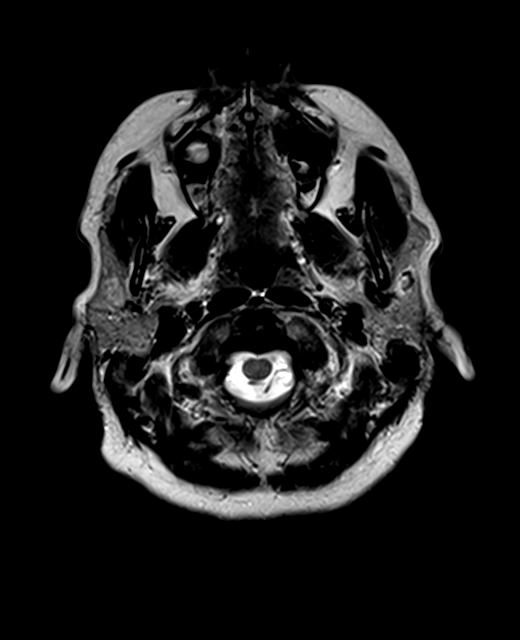
[im 27/27]
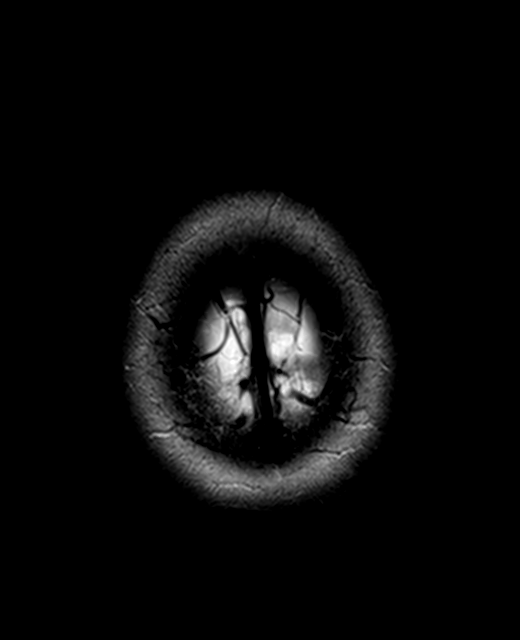

[Series 10: FLAIR · axial · 3.0mm · 0.72mm/px · z∈[-55,+83]mm · 2 of 26 slices shown]
[im 1/26]
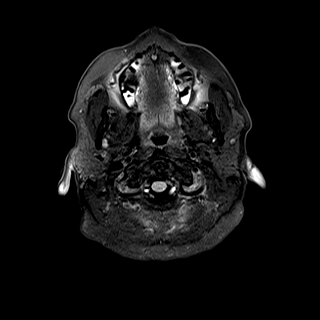
[im 26/26]
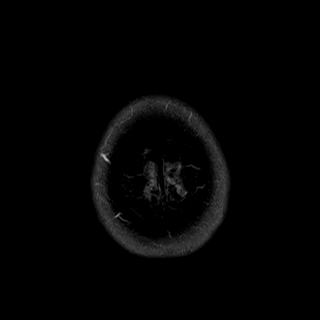

[Series 12: swi_images · axial · 2.2mm · 0.90mm/px · z∈[-54,+84]mm · 5 of 64 slices shown]
[im 1/64]
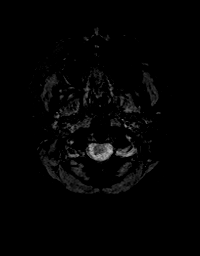
[im 16/64]
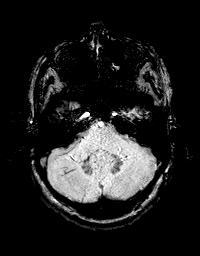
[im 32/64]
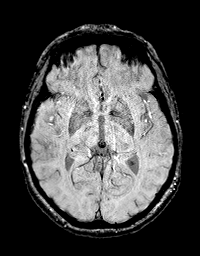
[im 48/64]
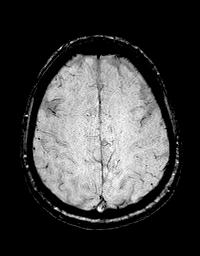
[im 64/64]
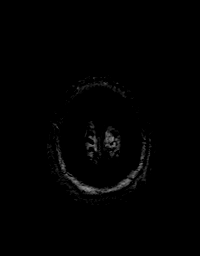

[Series 13: T1 · axial · 1.0mm · 0.94mm/px · z∈[-64,+94]mm · 11 of 160 slices shown (2 of 2)]
[im 1/160]
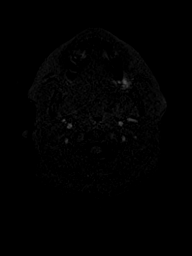
[im 16/160]
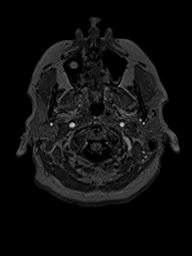
[im 32/160]
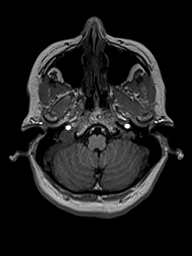
[im 48/160]
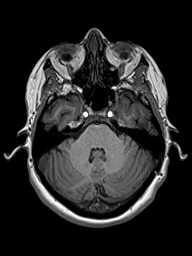
[im 64/160]
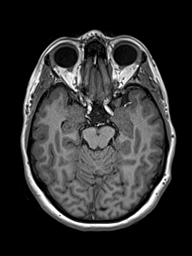
[im 80/160]
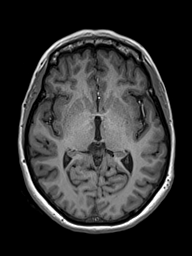
[im 96/160]
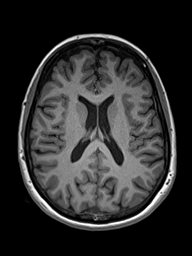
[im 112/160]
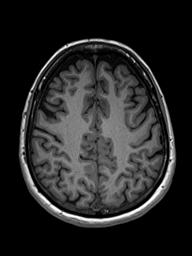
[im 128/160]
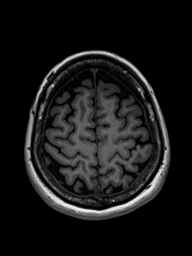
[im 144/160]
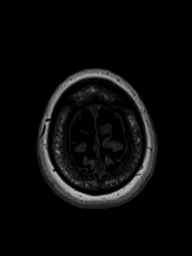
[im 160/160]
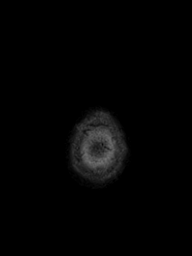

[Series 14: T2 · coronal · 4.5mm · 0.36mm/px · 2 of 26 slices shown (2 of 2)]
[im 1/26]
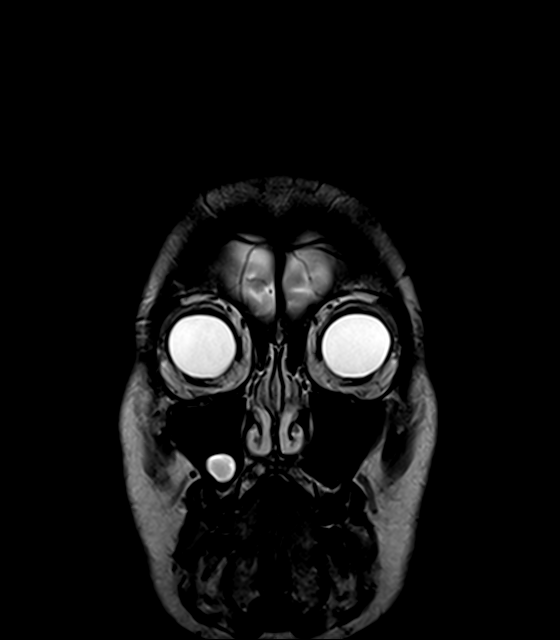
[im 26/26]
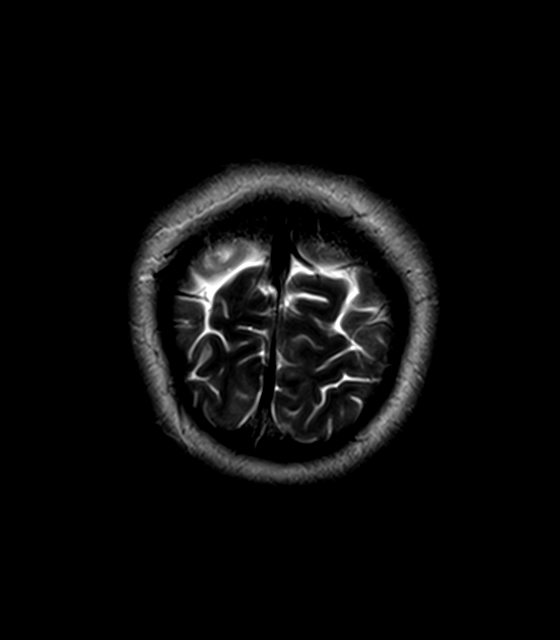

[43 of 48 positions shown; findings below may reference images not displayed]

FINDINGS: Brain: There is no evidence of an acute infarct, intracranial
hemorrhage, mass, midline shift, or extra-axial fluid collection.
The ventricles and sulci are normal. The brain is normal in signal.
Limited thin section imaging through the internal auditory canals
demonstrates a normal appearance of the seventh and eighth cranial
nerve complexes with normal fluid signal in the labyrinthine
structures.

Vascular: Major intracranial vascular flow voids are preserved.

Skull and upper cervical spine: Unremarkable bone marrow signal.

Sinuses/Orbits: Unremarkable orbits. Small mucous retention cysts in
the maxillary sinuses. Trace bilateral mastoid fluid.

Other: None.
IMPRESSION: Unremarkable appearance of the brain.

## 2021-03-28 ENCOUNTER — Other Ambulatory Visit (INDEPENDENT_AMBULATORY_CARE_PROVIDER_SITE_OTHER): Payer: Federal, State, Local not specified - PPO

## 2021-03-28 DIAGNOSIS — I10 Essential (primary) hypertension: Secondary | ICD-10-CM

## 2021-03-28 DIAGNOSIS — Z0000 Encounter for general adult medical examination without abnormal findings: Secondary | ICD-10-CM

## 2021-03-28 LAB — URINALYSIS, ROUTINE W REFLEX MICROSCOPIC
Bilirubin Urine: NEGATIVE
Hgb urine dipstick: NEGATIVE
Ketones, ur: NEGATIVE
Leukocytes,Ua: NEGATIVE
Nitrite: NEGATIVE
Specific Gravity, Urine: 1.02 (ref 1.000–1.030)
Total Protein, Urine: NEGATIVE
Urine Glucose: NEGATIVE
Urobilinogen, UA: 0.2 (ref 0.0–1.0)
pH: 8 (ref 5.0–8.0)

## 2021-03-28 LAB — LIPID PANEL
Cholesterol: 181 mg/dL (ref 0–200)
HDL: 63.4 mg/dL (ref >39.00)
LDL Cholesterol: 80 mg/dL (ref 0–99)
NonHDL: 117.23
Total CHOL/HDL Ratio: 3
Triglycerides: 188 mg/dL — ABNORMAL HIGH (ref 0.0–149.0)
VLDL: 37.6 mg/dL (ref 0.0–40.0)

## 2021-03-28 LAB — CBC WITH DIFFERENTIAL/PLATELET
Basophils Absolute: 0.1 10*3/uL (ref 0.0–0.1)
Basophils Relative: 0.8 % (ref 0.0–3.0)
Eosinophils Absolute: 0.1 10*3/uL (ref 0.0–0.7)
Eosinophils Relative: 2 % (ref 0.0–5.0)
HCT: 39.5 % (ref 36.0–46.0)
Hemoglobin: 13 g/dL (ref 12.0–15.0)
Lymphocytes Relative: 19.8 % (ref 12.0–46.0)
Lymphs Abs: 1.4 10*3/uL (ref 0.7–4.0)
MCHC: 33 g/dL (ref 30.0–36.0)
MCV: 88.7 fl (ref 78.0–100.0)
Monocytes Absolute: 0.5 10*3/uL (ref 0.1–1.0)
Monocytes Relative: 6.7 % (ref 3.0–12.0)
Neutro Abs: 5 10*3/uL (ref 1.4–7.7)
Neutrophils Relative %: 70.7 % (ref 43.0–77.0)
Platelets: 284 10*3/uL (ref 150.0–400.0)
RBC: 4.45 Mil/uL (ref 3.87–5.11)
RDW: 13.8 % (ref 11.5–15.5)
WBC: 7.1 10*3/uL (ref 4.0–10.5)

## 2021-03-28 LAB — HEPATIC FUNCTION PANEL
ALT: 14 U/L (ref 0–35)
AST: 17 U/L (ref 0–37)
Albumin: 4.7 g/dL (ref 3.5–5.2)
Alkaline Phosphatase: 52 U/L (ref 39–117)
Bilirubin, Direct: 0.1 mg/dL (ref 0.0–0.3)
Total Bilirubin: 0.5 mg/dL (ref 0.2–1.2)
Total Protein: 7.6 g/dL (ref 6.0–8.3)

## 2021-03-28 LAB — BASIC METABOLIC PANEL
BUN: 11 mg/dL (ref 6–23)
CO2: 29 mEq/L (ref 19–32)
Calcium: 9.7 mg/dL (ref 8.4–10.5)
Chloride: 103 mEq/L (ref 96–112)
Creatinine, Ser: 0.72 mg/dL (ref 0.40–1.20)
GFR: 89.1 mL/min (ref >60.00)
Glucose, Bld: 86 mg/dL (ref 70–99)
Potassium: 4.3 mEq/L (ref 3.5–5.1)
Sodium: 139 mEq/L (ref 135–145)

## 2021-03-28 LAB — TSH: TSH: 1.5 u[IU]/mL (ref 0.35–5.50)

## 2021-04-04 ENCOUNTER — Other Ambulatory Visit: Payer: Self-pay

## 2021-04-04 ENCOUNTER — Ambulatory Visit (INDEPENDENT_AMBULATORY_CARE_PROVIDER_SITE_OTHER): Payer: Federal, State, Local not specified - PPO | Admitting: Internal Medicine

## 2021-04-04 ENCOUNTER — Encounter: Payer: Self-pay | Admitting: Internal Medicine

## 2021-04-04 VITALS — BP 136/72 | HR 94 | Temp 98.8°F | Ht 65.0 in | Wt 147.0 lb

## 2021-04-04 DIAGNOSIS — Z0000 Encounter for general adult medical examination without abnormal findings: Secondary | ICD-10-CM | POA: Diagnosis not present

## 2021-04-04 DIAGNOSIS — E559 Vitamin D deficiency, unspecified: Secondary | ICD-10-CM

## 2021-04-04 DIAGNOSIS — I10 Essential (primary) hypertension: Secondary | ICD-10-CM | POA: Diagnosis not present

## 2021-04-04 DIAGNOSIS — E538 Deficiency of other specified B group vitamins: Secondary | ICD-10-CM

## 2021-04-04 DIAGNOSIS — R739 Hyperglycemia, unspecified: Secondary | ICD-10-CM | POA: Diagnosis not present

## 2021-04-04 MED ORDER — LISINOPRIL 20 MG PO TABS: 20.0000 mg | ORAL_TABLET | Freq: Every day | ORAL | 3 refills | Status: DC

## 2021-04-04 NOTE — Progress Notes (Signed)
Patient ID: Jane Mcdonald, female   DOB: [birth year], 65 y.o.   MRN: [id number]         Chief Complaint:: wellness exam and htn       HPI:  Jane Mcdonald is a 65 y.o. female here for wellness exam; declines covid booster, o/w up to date                        Also Pt denies chest pain, increased sob or doe, wheezing, orthopnea, PND, increased LE swelling, palpitations, dizziness or syncope.   Pt denies polydipsia, polyuria, or new focal neuro s/s.  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   Pt denies fever, wt loss, night sweats, loss of appetite, or other constitutional symptoms  No other new complaints Wt Readings from Last 3 Encounters:  04/04/21 147 lb (66.7 kg)  07/15/20 147 lb (66.7 kg)  03/30/20 140 lb (63.5 kg)   BP Readings from Last 3 Encounters:  04/04/21 136/72  07/15/20 124/76  03/30/20 140/60   Immunization History  Administered Date(s) Administered   Influenza Split 01/31/2011, 02/01/2012   Influenza Whole 03/16/2008, 03/19/2009, 03/30/2010   Influenza,inj,Quad PF,6+ Mos 02/06/2013, 02/12/2014, 02/18/2015, 03/13/2017, 03/03/2018, 02/27/2019   Influenza-Unspecified 03/05/2020, 03/19/2021   Janssen (J&J) SARS-COV-2 Vaccination 07/20/2019   Moderna Sars-Covid-2 Vaccination 11/15/2020   Td 09/06/2006   Tdap 03/13/2017   Zoster Recombinat (Shingrix) 01/09/2018, 03/14/2018   There are no preventive care reminders to display for this patient.     Past Medical History:  Diagnosis Date   ANXIETY, SITUATIONAL 07/14/2010   FOOT PAIN, BILATERAL 03/30/2010   Glaucoma    sees optho/Dr. Dione Booze every 6 months   Glaucoma (increased eye pressure) 02/01/2012   HYPERTENSION 07/14/2010   Palpitations    Renal artery stenosis, native, bilateral (HCC) 07/28/2010   Rosacea    SMOKER 07/14/2010   Past Surgical History:  Procedure Laterality Date   DILATION AND CURETTAGE OF UTERUS     s/p   OOPHORECTOMY     left only 2012, non malignant mass   TUBAL LIGATION       reports that she has never smoked. She has never used smokeless tobacco. She reports current alcohol use. She reports that she does not use drugs. family history includes Cancer in an other family member; Heart disease in her father; Hypertension in her mother; Nephrolithiasis in her father. No Known Allergies Current Outpatient Medications on File Prior to Visit  Medication Sig Dispense Refill   aspirin 81 MG EC tablet Take 1 tablet (81 mg total) by mouth daily. Swallow whole. 30 tablet 12   diclofenac sodium (VOLTAREN) 1 % GEL Apply 2 g topically 4 (four) times daily. 100 g 5   latanoprost (XALATAN) 0.005 % ophthalmic solution 1 drop. Use as directed     Multiple Vitamins-Minerals (VITAMIN D3 COMPLETE PO) Take by mouth.     triamcinolone (NASACORT) 55 MCG/ACT AERO nasal inhaler Place 2 sprays into the nose daily. 1 Inhaler 12   No current facility-administered medications on file prior to visit.        ROS:  All others reviewed and negative.  Objective        PE:  BP 136/72 (BP Location: Right Arm, Patient Position: Sitting, Cuff Size: Normal)   Pulse 94   Temp 98.8 F (37.1 C) (Oral)   Ht 5\' 5"  (1.651 m)   Wt 147 lb (66.7 kg)   SpO2 95%   BMI 24.46 kg/m  Constitutional: Pt appears in NAD               HENT: Head: NCAT.                Right Ear: External ear normal.                 Left Ear: External ear normal.                Eyes: . Pupils are equal, round, and reactive to light. Conjunctivae and EOM are normal               Nose: without d/c or deformity               Neck: Neck supple. Gross normal ROM               Cardiovascular: Normal rate and regular rhythm.                 Pulmonary/Chest: Effort normal and breath sounds without rales or wheezing.                Abd:  Soft, NT, ND, + BS, no organomegaly               Neurological: Pt is alert. At baseline orientation, motor grossly intact               Skin: Skin is warm. No rashes, no other new  lesions, LE edema - none               Psychiatric: Pt behavior is normal without agitation   Micro: none  Cardiac tracings I have personally interpreted today:  none  Pertinent Radiological findings (summarize): none   Lab Results  Component Value Date   WBC 7.1 03/28/2021   HGB 13.0 03/28/2021   HCT 39.5 03/28/2021   PLT 284.0 03/28/2021   GLUCOSE 86 03/28/2021   CHOL 181 03/28/2021   TRIG 188.0 (H) 03/28/2021   HDL 63.40 03/28/2021   LDLCALC 80 03/28/2021   ALT 14 03/28/2021   AST 17 03/28/2021   NA 139 03/28/2021   K 4.3 03/28/2021   CL 103 03/28/2021   CREATININE 0.72 03/28/2021   BUN 11 03/28/2021   CO2 29 03/28/2021   TSH 1.50 03/28/2021   HGBA1C 5.6 03/23/2020   MICROALBUR <0.7 03/03/2016   Assessment/Plan:  Jane Mcdonald is a 65 y.o. White or Caucasian [1] female with  has a past medical history of ANXIETY, SITUATIONAL (07/14/2010), FOOT PAIN, BILATERAL (03/30/2010), Glaucoma, Glaucoma (increased eye pressure) (02/01/2012), HYPERTENSION (07/14/2010), Palpitations, Renal artery stenosis, native, bilateral (HCC) (07/28/2010), Rosacea, and SMOKER (07/14/2010).  Preventative health care Age and sex appropriate education and counseling updated with regular exercise and diet Referrals for preventative services - none needed Immunizations addressed - declines covid booster Smoking counseling  - none needed Evidence for depression or other mood disorder - none significant Most recent labs reviewed. I have personally reviewed and have noted: 1) the patient's medical and social history 2) The patient's current medications and supplements 3) The patient's height, weight, and BMI have been recorded in the chart   Essential hypertension BP Readings from Last 3 Encounters:  04/04/21 136/72  07/15/20 124/76  03/30/20 140/60   Stable, pt to continue medical treatment lisinopril  Followup: Return in about 1 year (around 04/04/2022).  Oliver Barre, MD 04/08/2021 9:47 PM [facility]

## 2021-04-04 NOTE — Patient Instructions (Signed)
We have discussed the Cardiac CT Score test to measure the calcification level (if any) in your heart arteries.  This test has been ordered in our Computer System, so please call [facility] directly, as they prefer this, at [phone] to be scheduled.  Please continue all other medications as before, and refills have been done if requested.  Please have the pharmacy call with any other refills you may need.  Please continue your efforts at being more active, low cholesterol diet, and weight control.  You are otherwise up to date with prevention measures today.  Please keep your appointments with your specialists as you may have planned  Please make an Appointment to return for your 1 year visit, or sooner if needed, with Lab testing by Appointment as well, to be done about 3-5 days before at the FIRST FLOOR Lab (so this is for TWO appointments - please see the scheduling desk as you leave)   Due to the ongoing Covid 19 pandemic, our lab now requires an appointment for any labs done at our office.  If you need labs done and do not have an appointment, please call our office ahead of time to schedule before presenting to the lab for your testing.  

## 2021-04-08 ENCOUNTER — Encounter: Payer: Self-pay | Admitting: Internal Medicine

## 2021-04-08 NOTE — Assessment & Plan Note (Addendum)

## 2021-04-08 NOTE — Assessment & Plan Note (Signed)
BP Readings from Last 3 Encounters:  04/04/21 136/72  07/15/20 124/76  03/30/20 140/60   Stable, pt to continue medical treatment lisinopril

## 2021-05-16 DIAGNOSIS — Z01419 Encounter for gynecological examination (general) (routine) without abnormal findings: Secondary | ICD-10-CM | POA: Diagnosis not present

## 2021-05-16 DIAGNOSIS — Z1151 Encounter for screening for human papillomavirus (HPV): Secondary | ICD-10-CM | POA: Diagnosis not present

## 2021-05-16 DIAGNOSIS — Z124 Encounter for screening for malignant neoplasm of cervix: Secondary | ICD-10-CM | POA: Diagnosis not present

## 2021-05-16 DIAGNOSIS — Z6825 Body mass index (BMI) 25.0-25.9, adult: Secondary | ICD-10-CM | POA: Diagnosis not present

## 2021-05-18 DIAGNOSIS — H40053 Ocular hypertension, bilateral: Secondary | ICD-10-CM | POA: Diagnosis not present

## 2021-05-18 DIAGNOSIS — H2513 Age-related nuclear cataract, bilateral: Secondary | ICD-10-CM | POA: Diagnosis not present

## 2021-06-24 DIAGNOSIS — Z7982 Long term (current) use of aspirin: Secondary | ICD-10-CM | POA: Diagnosis not present

## 2021-06-24 DIAGNOSIS — K5730 Diverticulosis of large intestine without perforation or abscess without bleeding: Secondary | ICD-10-CM | POA: Diagnosis not present

## 2021-06-24 DIAGNOSIS — Z87442 Personal history of urinary calculi: Secondary | ICD-10-CM | POA: Diagnosis not present

## 2021-06-24 DIAGNOSIS — R109 Unspecified abdominal pain: Secondary | ICD-10-CM | POA: Diagnosis not present

## 2021-06-24 DIAGNOSIS — N201 Calculus of ureter: Secondary | ICD-10-CM | POA: Diagnosis not present

## 2021-06-24 DIAGNOSIS — Z87891 Personal history of nicotine dependence: Secondary | ICD-10-CM | POA: Diagnosis not present

## 2021-06-24 DIAGNOSIS — N1330 Unspecified hydronephrosis: Secondary | ICD-10-CM | POA: Diagnosis not present

## 2021-06-24 DIAGNOSIS — Z466 Encounter for fitting and adjustment of urinary device: Secondary | ICD-10-CM | POA: Diagnosis not present

## 2021-06-24 DIAGNOSIS — Z79899 Other long term (current) drug therapy: Secondary | ICD-10-CM | POA: Diagnosis not present

## 2021-06-24 DIAGNOSIS — N132 Hydronephrosis with renal and ureteral calculous obstruction: Secondary | ICD-10-CM | POA: Diagnosis not present

## 2021-06-29 DIAGNOSIS — N201 Calculus of ureter: Secondary | ICD-10-CM | POA: Diagnosis not present

## 2021-07-06 DIAGNOSIS — N201 Calculus of ureter: Secondary | ICD-10-CM | POA: Diagnosis not present

## 2021-07-06 DIAGNOSIS — Z466 Encounter for fitting and adjustment of urinary device: Secondary | ICD-10-CM | POA: Diagnosis not present

## 2021-07-06 DIAGNOSIS — Z87442 Personal history of urinary calculi: Secondary | ICD-10-CM | POA: Diagnosis not present

## 2021-07-19 DIAGNOSIS — H40053 Ocular hypertension, bilateral: Secondary | ICD-10-CM | POA: Diagnosis not present

## 2021-07-27 DIAGNOSIS — L570 Actinic keratosis: Secondary | ICD-10-CM | POA: Diagnosis not present

## 2021-07-27 DIAGNOSIS — Z85828 Personal history of other malignant neoplasm of skin: Secondary | ICD-10-CM | POA: Diagnosis not present

## 2021-07-27 DIAGNOSIS — L820 Inflamed seborrheic keratosis: Secondary | ICD-10-CM | POA: Diagnosis not present

## 2021-07-27 DIAGNOSIS — L814 Other melanin hyperpigmentation: Secondary | ICD-10-CM | POA: Diagnosis not present

## 2021-07-27 DIAGNOSIS — C44519 Basal cell carcinoma of skin of other part of trunk: Secondary | ICD-10-CM | POA: Diagnosis not present

## 2021-07-27 DIAGNOSIS — D485 Neoplasm of uncertain behavior of skin: Secondary | ICD-10-CM | POA: Diagnosis not present

## 2021-07-27 DIAGNOSIS — L821 Other seborrheic keratosis: Secondary | ICD-10-CM | POA: Diagnosis not present

## 2021-07-27 DIAGNOSIS — D1801 Hemangioma of skin and subcutaneous tissue: Secondary | ICD-10-CM | POA: Diagnosis not present

## 2021-08-30 NOTE — Telephone Encounter (Signed)
NOTE NOT NEEDED ?

## 2021-10-10 ENCOUNTER — Ambulatory Visit
Admission: RE | Admit: 2021-10-10 | Discharge: 2021-10-10 | Disposition: A | Discharge status: Home / Self Care | Payer: Self-pay | Source: Ambulatory Visit | Attending: Internal Medicine | Admitting: Internal Medicine

## 2021-10-10 DIAGNOSIS — I10 Essential (primary) hypertension: Secondary | ICD-10-CM

## 2021-10-11 DIAGNOSIS — R8290 Unspecified abnormal findings in urine: Secondary | ICD-10-CM | POA: Diagnosis not present

## 2021-10-11 DIAGNOSIS — K5900 Constipation, unspecified: Secondary | ICD-10-CM | POA: Diagnosis not present

## 2021-10-11 DIAGNOSIS — Z87442 Personal history of urinary calculi: Secondary | ICD-10-CM | POA: Diagnosis not present

## 2021-12-14 ENCOUNTER — Other Ambulatory Visit: Payer: Self-pay | Admitting: Internal Medicine

## 2021-12-14 DIAGNOSIS — Z1231 Encounter for screening mammogram for malignant neoplasm of breast: Secondary | ICD-10-CM

## 2021-12-15 ENCOUNTER — Ambulatory Visit (INDEPENDENT_AMBULATORY_CARE_PROVIDER_SITE_OTHER): Payer: Federal, State, Local not specified - PPO

## 2021-12-15 DIAGNOSIS — Z1231 Encounter for screening mammogram for malignant neoplasm of breast: Secondary | ICD-10-CM | POA: Diagnosis not present

## 2022-01-25 DIAGNOSIS — H40053 Ocular hypertension, bilateral: Secondary | ICD-10-CM | POA: Diagnosis not present

## 2022-01-25 DIAGNOSIS — H2513 Age-related nuclear cataract, bilateral: Secondary | ICD-10-CM | POA: Diagnosis not present

## 2022-04-03 ENCOUNTER — Other Ambulatory Visit (INDEPENDENT_AMBULATORY_CARE_PROVIDER_SITE_OTHER): Payer: Federal, State, Local not specified - PPO

## 2022-04-03 DIAGNOSIS — E538 Deficiency of other specified B group vitamins: Secondary | ICD-10-CM | POA: Diagnosis not present

## 2022-04-03 DIAGNOSIS — E559 Vitamin D deficiency, unspecified: Secondary | ICD-10-CM

## 2022-04-03 DIAGNOSIS — R739 Hyperglycemia, unspecified: Secondary | ICD-10-CM | POA: Diagnosis not present

## 2022-04-03 DIAGNOSIS — I10 Essential (primary) hypertension: Secondary | ICD-10-CM | POA: Diagnosis not present

## 2022-04-03 LAB — CBC WITH DIFFERENTIAL/PLATELET
Basophils Absolute: 0.1 10*3/uL (ref 0.0–0.1)
Basophils Relative: 1.1 % (ref 0.0–3.0)
Eosinophils Absolute: 0.1 10*3/uL (ref 0.0–0.7)
Eosinophils Relative: 2.1 % (ref 0.0–5.0)
HCT: 39.7 % (ref 36.0–46.0)
Hemoglobin: 13.5 g/dL (ref 12.0–15.0)
Lymphocytes Relative: 23.8 % (ref 12.0–46.0)
Lymphs Abs: 1.6 10*3/uL (ref 0.7–4.0)
MCHC: 34.1 g/dL (ref 30.0–36.0)
MCV: 87.8 fl (ref 78.0–100.0)
Monocytes Absolute: 0.6 10*3/uL (ref 0.1–1.0)
Monocytes Relative: 8.5 % (ref 3.0–12.0)
Neutro Abs: 4.2 10*3/uL (ref 1.4–7.7)
Neutrophils Relative %: 64.5 % (ref 43.0–77.0)
Platelets: 289 10*3/uL (ref 150.0–400.0)
RBC: 4.52 Mil/uL (ref 3.87–5.11)
RDW: 13.3 % (ref 11.5–15.5)
WBC: 6.5 10*3/uL (ref 4.0–10.5)

## 2022-04-03 LAB — URINALYSIS, ROUTINE W REFLEX MICROSCOPIC
Bilirubin Urine: NEGATIVE
Ketones, ur: NEGATIVE
Leukocytes,Ua: NEGATIVE
Nitrite: NEGATIVE
Specific Gravity, Urine: 1.02 (ref 1.000–1.030)
Total Protein, Urine: NEGATIVE
Urine Glucose: NEGATIVE
Urobilinogen, UA: 0.2 (ref 0.0–1.0)
pH: 7.5 (ref 5.0–8.0)

## 2022-04-03 LAB — HEPATIC FUNCTION PANEL
ALT: 12 U/L (ref 0–35)
AST: 15 U/L (ref 0–37)
Albumin: 4.6 g/dL (ref 3.5–5.2)
Alkaline Phosphatase: 50 U/L (ref 39–117)
Bilirubin, Direct: 0.1 mg/dL (ref 0.0–0.3)
Total Bilirubin: 0.5 mg/dL (ref 0.2–1.2)
Total Protein: 7.4 g/dL (ref 6.0–8.3)

## 2022-04-03 LAB — LIPID PANEL
Cholesterol: 172 mg/dL (ref 0–200)
HDL: 59.5 mg/dL (ref >39.00)
LDL Cholesterol: 77 mg/dL (ref 0–99)
NonHDL: 112.98
Total CHOL/HDL Ratio: 3
Triglycerides: 181 mg/dL — ABNORMAL HIGH (ref 0.0–149.0)
VLDL: 36.2 mg/dL (ref 0.0–40.0)

## 2022-04-03 LAB — BASIC METABOLIC PANEL
BUN: 14 mg/dL (ref 6–23)
CO2: 27 mEq/L (ref 19–32)
Calcium: 9.4 mg/dL (ref 8.4–10.5)
Chloride: 99 mEq/L (ref 96–112)
Creatinine, Ser: 0.81 mg/dL (ref 0.40–1.20)
GFR: 76.8 mL/min (ref >60.00)
Glucose, Bld: 89 mg/dL (ref 70–99)
Potassium: 4.2 mEq/L (ref 3.5–5.1)
Sodium: 136 mEq/L (ref 135–145)

## 2022-04-03 LAB — HEMOGLOBIN A1C: Hgb A1c MFr Bld: 5.9 % (ref 4.6–6.5)

## 2022-04-03 LAB — VITAMIN D 25 HYDROXY (VIT D DEFICIENCY, FRACTURES): VITD: 68.04 ng/mL (ref 30.00–100.00)

## 2022-04-03 LAB — TSH: TSH: 1.94 u[IU]/mL (ref 0.35–5.50)

## 2022-04-03 LAB — VITAMIN B12: Vitamin B-12: 686 pg/mL (ref 211–911)

## 2022-04-06 ENCOUNTER — Ambulatory Visit (INDEPENDENT_AMBULATORY_CARE_PROVIDER_SITE_OTHER): Payer: Federal, State, Local not specified - PPO | Admitting: Internal Medicine

## 2022-04-06 VITALS — BP 120/62 | HR 70 | Temp 98.5°F | Ht 65.0 in | Wt 149.0 lb

## 2022-04-06 DIAGNOSIS — R739 Hyperglycemia, unspecified: Secondary | ICD-10-CM

## 2022-04-06 DIAGNOSIS — E538 Deficiency of other specified B group vitamins: Secondary | ICD-10-CM

## 2022-04-06 DIAGNOSIS — I10 Essential (primary) hypertension: Secondary | ICD-10-CM

## 2022-04-06 DIAGNOSIS — Z0000 Encounter for general adult medical examination without abnormal findings: Secondary | ICD-10-CM

## 2022-04-06 DIAGNOSIS — E559 Vitamin D deficiency, unspecified: Secondary | ICD-10-CM

## 2022-04-06 DIAGNOSIS — E2839 Other primary ovarian failure: Secondary | ICD-10-CM | POA: Diagnosis not present

## 2022-04-06 MED ORDER — LISINOPRIL 20 MG PO TABS: 20.0000 mg | ORAL_TABLET | Freq: Every day | ORAL | 3 refills | Status: DC

## 2022-04-06 NOTE — Patient Instructions (Signed)
Please schedule the bone density test before leaving today at the scheduling desk (where you check out)  Please continue all other medications as before, and refills have been done if requested.  Please have the pharmacy call with any other refills you may need.  Please continue your efforts at being more active, low cholesterol diet, and weight control.  You are otherwise up to date with prevention measures today.  Please keep your appointments with your specialists as you may have planned  Please make an Appointment to return for your 1 year visit, or sooner if needed, with Lab testing by Appointment as well, to be done about 3-5 days before at the FIRST FLOOR Lab (so this is for TWO appointments - please see the scheduling desk as you leave)

## 2022-04-06 NOTE — Progress Notes (Signed)
Patient ID: Jane Mcdonald, female   DOB: 02-07-1956, 66 y.o.   MRN: [id number]         Chief Complaint:: wellness exam        HPI:  Jane Mcdonald is a 66 y.o. female here for wellness exam, declines covid booster, o/w up to date. Due for DXA                        Also o/w doing ok, Pt denies chest pain, increased sob or doe, wheezing, orthopnea, PND, increased LE swelling, palpitations, dizziness or syncope.   Pt denies polydipsia, polyuria, or new focal neuro s/s.    Pt denies fever, wt loss, night sweats, loss of appetite, or other constitutional symptoms  Does have some hair thinning and dandruff at times recently.     Wt Readings from Last 3 Encounters:  04/06/22 149 lb (67.6 kg)  04/04/21 147 lb (66.7 kg)  07/15/20 147 lb (66.7 kg)   BP Readings from Last 3 Encounters:  04/06/22 120/62  04/04/21 136/72  07/15/20 124/76   Immunization History  Administered Date(s) Administered   Influenza Split 01/31/2011, 02/01/2012   Influenza Whole 03/16/2008, 03/19/2009, 03/30/2010   Influenza,inj,Quad PF,6+ Mos 02/06/2013, 02/12/2014, 02/18/2015, 03/13/2017, 03/03/2018, 02/27/2019   Influenza-Unspecified 03/05/2020, 03/19/2021, 03/20/2022   Janssen (J&J) SARS-COV-2 Vaccination 07/20/2019   Moderna Sars-Covid-2 Vaccination 11/15/2020   Td 09/06/2006   Tdap 03/13/2017   Zoster Recombinat (Shingrix) 01/09/2018, 03/14/2018   Health Maintenance Due  Topic Date Due   COVID-19 Vaccine (3 - 2023-24 season) 01/06/2022      Past Medical History:  Diagnosis Date   ANXIETY, SITUATIONAL 07/14/2010   FOOT PAIN, BILATERAL 03/30/2010   Glaucoma    sees optho/Dr. Dione Booze every 6 months   Glaucoma (increased eye pressure) 02/01/2012   HYPERTENSION 07/14/2010   Palpitations    Renal artery stenosis, native, bilateral ([facility]) 07/28/2010   Rosacea    SMOKER 07/14/2010   Past Surgical History:  Procedure Laterality Date   DILATION AND CURETTAGE OF UTERUS     s/p   OOPHORECTOMY     left only 2012, non  malignant mass   TUBAL LIGATION      reports that she has never smoked. She has never used smokeless tobacco. She reports current alcohol use. She reports that she does not use drugs. family history includes Cancer in an other family member; Heart disease in her father; Hypertension in her mother; Nephrolithiasis in her father. No Known Allergies Current Outpatient Medications on File Prior to Visit  Medication Sig Dispense Refill   aspirin 81 MG EC tablet Take 1 tablet (81 mg total) by mouth daily. Swallow whole. 30 tablet 12   diclofenac sodium (VOLTAREN) 1 % GEL Apply 2 g topically 4 (four) times daily. 100 g 5   latanoprost (XALATAN) 0.005 % ophthalmic solution 1 drop. Use as directed     Multiple Vitamins-Minerals (VITAMIN D3 COMPLETE PO) Take by mouth.     triamcinolone (NASACORT) 55 MCG/ACT AERO nasal inhaler Place 2 sprays into the nose daily. 1 Inhaler 12   No current facility-administered medications on file prior to visit.        ROS:  All others reviewed and negative.  Objective        PE:  BP 120/62 (BP Location: Right Arm, Patient Position: Sitting, Cuff Size: Large)   Pulse 70   Temp 98.5 F (36.9 C) (Oral)   Ht 5\' 5"  (1.651 m)   Wt  149 lb (67.6 kg)   LMP 08/16/2010   SpO2 98%   BMI 24.79 kg/m                 Constitutional: Pt appears in NAD               HENT: Head: NCAT.                Right Ear: External ear normal.                 Left Ear: External ear normal.                Eyes: . Pupils are equal, round, and reactive to light. Conjunctivae and EOM are normal               Nose: without d/c or deformity               Neck: Neck supple. Gross normal ROM               Cardiovascular: Normal rate and regular rhythm.                 Pulmonary/Chest: Effort normal and breath sounds without rales or wheezing.                Abd:  Soft, NT, ND, + BS, no organomegaly               Neurological: Pt is alert. At baseline orientation, motor grossly intact                Skin: Skin is warm. No rashes, no other new lesions, LE edema - none               Psychiatric: Pt behavior is normal without agitation   Micro: none  Cardiac tracings I have personally interpreted today:  none  Pertinent Radiological findings (summarize): none   Lab Results  Component Value Date   WBC 6.5 04/03/2022   HGB 13.5 04/03/2022   HCT 39.7 04/03/2022   PLT 289.0 04/03/2022   GLUCOSE 89 04/03/2022   CHOL 172 04/03/2022   TRIG 181.0 (H) 04/03/2022   HDL 59.50 04/03/2022   LDLCALC 77 04/03/2022   ALT 12 04/03/2022   AST 15 04/03/2022   NA 136 04/03/2022   K 4.2 04/03/2022   CL 99 04/03/2022   CREATININE 0.81 04/03/2022   BUN 14 04/03/2022   CO2 27 04/03/2022   TSH 1.94 04/03/2022   HGBA1C 5.9 04/03/2022   MICROALBUR <0.7 03/03/2016   Assessment/Plan:  Jane Mcdonald is a 66 y.o. White or Caucasian [1] female with  has a past medical history of ANXIETY, SITUATIONAL (07/14/2010), FOOT PAIN, BILATERAL (03/30/2010), Glaucoma, Glaucoma (increased eye pressure) (02/01/2012), HYPERTENSION (07/14/2010), Palpitations, Renal artery stenosis, native, bilateral ([place]) (07/28/2010), Rosacea, and SMOKER (07/14/2010).  Preventative health care Age and sex appropriate education and counseling updated with regular exercise and diet Referrals for preventative services - for DXA Immunizations addressed - none needed Smoking counseling  - none needed Evidence for depression or other mood disorder - none significant Most recent labs reviewed. I have personally reviewed and have noted: 1) the patient's medical and social history 2) The patient's current medications and supplements 3) The patient's height, weight, and BMI have been recorded in the chart  Followup: Return in about 1 year (around 04/07/2023).  Cathlean Cower, MD 04/06/2022 7:43 PM [facility]

## 2022-04-06 NOTE — Assessment & Plan Note (Signed)
Age and sex appropriate education and counseling updated with regular exercise and diet Referrals for preventative services - for DXA Immunizations addressed - none needed Smoking counseling  - none needed Evidence for depression or other mood disorder - none significant Most recent labs reviewed. I have personally reviewed and have noted: 1) the patient's medical and social history 2) The patient's current medications and supplements 3) The patient's height, weight, and BMI have been recorded in the chart  

## 2022-04-12 ENCOUNTER — Ambulatory Visit (INDEPENDENT_AMBULATORY_CARE_PROVIDER_SITE_OTHER)
Admission: RE | Admit: 2022-04-12 | Discharge: 2022-04-12 | Disposition: A | Discharge status: Home / Self Care | Payer: Federal, State, Local not specified - PPO | Source: Ambulatory Visit | Attending: Internal Medicine | Admitting: Internal Medicine

## 2022-04-12 DIAGNOSIS — E2839 Other primary ovarian failure: Secondary | ICD-10-CM | POA: Diagnosis not present

## 2022-04-16 ENCOUNTER — Other Ambulatory Visit: Payer: Self-pay | Admitting: Internal Medicine

## 2022-04-16 MED ORDER — ALENDRONATE SODIUM 70 MG PO TABS: 70.0000 mg | ORAL_TABLET | ORAL | 3 refills | Status: DC

## 2022-05-23 ENCOUNTER — Other Ambulatory Visit: Payer: Self-pay | Admitting: Internal Medicine

## 2022-05-23 NOTE — Telephone Encounter (Signed)
Please refill as per office routine med refill policy (all routine meds to be refilled for 3 mo or monthly (per pt preference) up to one year from last visit, then month to month grace period for 3 mo, then further med refills will have to be denied)

## 2022-05-25 ENCOUNTER — Telehealth: Payer: Self-pay | Admitting: Internal Medicine

## 2022-05-25 MED ORDER — IBANDRONATE SODIUM 150 MG PO TABS: 150.0000 mg | ORAL_TABLET | ORAL | 3 refills | Status: DC

## 2022-05-25 NOTE — Telephone Encounter (Signed)
Ok to try boniva 150 mg every 30 days (I sent script)

## 2022-05-25 NOTE — Telephone Encounter (Signed)
Patient called and said that the alendronate (FOSAMAX) 70 MG tablet isnt agreeing with her and she wanted to know if she can get something else instead. She said it felt like her gums were throbbing and stopped after she stopped taking it. Call back is [phone]

## 2022-05-26 NOTE — Telephone Encounter (Signed)
Notified pt w/MD response.../lmb 

## 2022-07-31 ENCOUNTER — Telehealth: Payer: Self-pay | Admitting: Internal Medicine

## 2022-07-31 NOTE — Telephone Encounter (Signed)
Patient wants to know if Dr. Jenny Reichmann can put in orders for an ultrasound. She thinks she may have an ovarian cyst and is concerned. She would like a call back at [phone].

## 2022-07-31 NOTE — Telephone Encounter (Signed)
I think she means pelvic pain - would need OV to consider options for eval and treatment

## 2022-08-01 NOTE — Telephone Encounter (Signed)
Called pt gave MD response. Made appt for tomorrow @ 2:00.Marland KitchenJohny Mcdonald

## 2022-08-02 ENCOUNTER — Encounter: Payer: Self-pay | Admitting: Internal Medicine

## 2022-08-02 ENCOUNTER — Ambulatory Visit (INDEPENDENT_AMBULATORY_CARE_PROVIDER_SITE_OTHER): Payer: Federal, State, Local not specified - PPO

## 2022-08-02 ENCOUNTER — Ambulatory Visit: Payer: Federal, State, Local not specified - PPO | Admitting: Internal Medicine

## 2022-08-02 VITALS — BP 128/72 | HR 89 | Temp 97.9°F | Wt 145.0 lb

## 2022-08-02 DIAGNOSIS — R1031 Right lower quadrant pain: Secondary | ICD-10-CM | POA: Insufficient documentation

## 2022-08-02 DIAGNOSIS — I10 Essential (primary) hypertension: Secondary | ICD-10-CM | POA: Diagnosis not present

## 2022-08-02 DIAGNOSIS — K5730 Diverticulosis of large intestine without perforation or abscess without bleeding: Secondary | ICD-10-CM | POA: Diagnosis not present

## 2022-08-02 DIAGNOSIS — F419 Anxiety disorder, unspecified: Secondary | ICD-10-CM | POA: Diagnosis not present

## 2022-08-02 DIAGNOSIS — N200 Calculus of kidney: Secondary | ICD-10-CM | POA: Diagnosis not present

## 2022-08-02 LAB — BASIC METABOLIC PANEL
BUN: 14 mg/dL (ref 6–23)
CO2: 30 mEq/L (ref 19–32)
Calcium: 10 mg/dL (ref 8.4–10.5)
Chloride: 102 mEq/L (ref 96–112)
Creatinine, Ser: 0.73 mg/dL (ref 0.40–1.20)
GFR: 86.81 mL/min (ref >60.00)
Glucose, Bld: 114 mg/dL — ABNORMAL HIGH (ref 70–99)
Potassium: 4.8 mEq/L (ref 3.5–5.1)
Sodium: 138 mEq/L (ref 135–145)

## 2022-08-02 LAB — HEPATIC FUNCTION PANEL
ALT: 12 U/L (ref 0–35)
AST: 18 U/L (ref 0–37)
Albumin: 4.8 g/dL (ref 3.5–5.2)
Alkaline Phosphatase: 42 U/L (ref 39–117)
Bilirubin, Direct: 0.1 mg/dL (ref 0.0–0.3)
Total Bilirubin: 0.4 mg/dL (ref 0.2–1.2)
Total Protein: 7.7 g/dL (ref 6.0–8.3)

## 2022-08-02 LAB — CBC WITH DIFFERENTIAL/PLATELET
Basophils Absolute: 0 10*3/uL (ref 0.0–0.1)
Basophils Relative: 0.4 % (ref 0.0–3.0)
Eosinophils Absolute: 0.1 10*3/uL (ref 0.0–0.7)
Eosinophils Relative: 0.8 % (ref 0.0–5.0)
HCT: 39.3 % (ref 36.0–46.0)
Hemoglobin: 13.3 g/dL (ref 12.0–15.0)
Lymphocytes Relative: 18.9 % (ref 12.0–46.0)
Lymphs Abs: 2 10*3/uL (ref 0.7–4.0)
MCHC: 33.9 g/dL (ref 30.0–36.0)
MCV: 87.8 fl (ref 78.0–100.0)
Monocytes Absolute: 0.6 10*3/uL (ref 0.1–1.0)
Monocytes Relative: 5.9 % (ref 3.0–12.0)
Neutro Abs: 7.9 10*3/uL — ABNORMAL HIGH (ref 1.4–7.7)
Neutrophils Relative %: 74 % (ref 43.0–77.0)
Platelets: 310 10*3/uL (ref 150.0–400.0)
RBC: 4.47 Mil/uL (ref 3.87–5.11)
RDW: 13.6 % (ref 11.5–15.5)
WBC: 10.7 10*3/uL — ABNORMAL HIGH (ref 4.0–10.5)

## 2022-08-02 LAB — LIPASE: Lipase: 32 U/L (ref 11.0–59.0)

## 2022-08-02 NOTE — Patient Instructions (Signed)
Please continue all other medications as before, and refills have been done if requested.  Please have the pharmacy call with any other refills you may need.  Please keep your appointments with your specialists as you may have planned  You will be contacted regarding the referral for: CT abd pelvis (to rule out stone but also checks the appendix and ovary)  Please go to the LAB at the blood drawing area for the tests to be done  You will be contacted by phone if any changes need to be made immediately.  Otherwise, you will receive a letter about your results with an explanation, but please check with MyChart first.  Please remember to sign up for MyChart if you have not done so, as this will be important to you in the future with finding out test results, communicating by private email, and scheduling acute appointments online when needed.

## 2022-08-02 NOTE — Progress Notes (Signed)
Patient ID: Jane Mcdonald, female   DOB: 1956/02/01, 67 y.o.   MRN: [id number]        Chief Complaint: follow up RLQ pain pelvic pain, htn, anxiety       HPI:  Jane Mcdonald is a 67 y.o. 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  Denies urinary symptoms such as dysuria, frequency, urgency, flank pain, hematuria or n/v, fever, chills.  Denies worsening reflux, dysphagia, n/v, bowel change or blood.  Pt denies chest pain, increased sob or doe, wheezing, orthopnea, PND, increased LE swelling, palpitations, dizziness or syncope.         Wt Readings from Last 3 Encounters:  08/02/22 145 lb (65.8 kg)  04/06/22 149 lb (67.6 kg)  04/04/21 147 lb (66.7 kg)   BP Readings from Last 3 Encounters:  08/02/22 128/72  04/06/22 120/62  04/04/21 136/72         Past Medical History:  Diagnosis Date   ANXIETY, SITUATIONAL 07/14/2010   FOOT PAIN, BILATERAL 03/30/2010   Glaucoma    sees optho/Dr. Katy Fitch every 6 months   Glaucoma (increased eye pressure) 02/01/2012   HYPERTENSION 07/14/2010   Palpitations    Renal artery stenosis, native, bilateral ([place]) 07/28/2010   Rosacea    SMOKER 07/14/2010   Past Surgical History:  Procedure Laterality Date   DILATION AND CURETTAGE OF UTERUS     s/p   OOPHORECTOMY     left only 2012, non malignant mass   TUBAL LIGATION      reports that she has never smoked. She has never used smokeless tobacco. She reports current alcohol use. She reports that she does not use drugs. family history includes Cancer in an other family member; Heart disease in her father; Hypertension in her mother; Nephrolithiasis in her father. No Known Allergies Current  Outpatient Medications on File Prior to Visit  Medication Sig Dispense Refill   alendronate (FOSAMAX) 70 MG tablet Take 1 tablet (70 mg total) by mouth every 7 (seven) days. Take with a full glass of water on an empty stomach. 12 tablet 3   aspirin 81 MG EC tablet Take 1 tablet (81 mg total) by mouth daily. Swallow whole. 30 tablet 12   diclofenac sodium (VOLTAREN) 1 % GEL Apply 2 g topically 4 (four) times daily. 100 g 5   ibandronate (BONIVA) 150 MG tablet Take 1 tablet (150 mg total) by mouth every 30 (thirty) days. Take in the morning with a full glass of water, on an empty stomach, and do not take anything else by mouth or lie down for the next 30 min. 3 tablet 3   latanoprost (XALATAN) 0.005 % ophthalmic solution 1 drop. Use as directed     lisinopril (ZESTRIL) 20 MG tablet Take 1 tablet (20 mg total) by mouth daily. 90 tablet 3   Multiple Vitamins-Minerals (VITAMIN D3 COMPLETE PO) Take by mouth.     triamcinolone (NASACORT) 55 MCG/ACT AERO nasal inhaler Place 2 sprays into the nose daily. 1 Inhaler 12   No current facility-administered medications on file prior to visit.        ROS:  All  others reviewed and negative.  Objective        PE:  BP 128/72   Pulse 89   Temp 97.9 F (36.6 C) (Temporal)   Wt 145 lb (65.8 kg)   LMP 08/16/2010   SpO2 96%   BMI 24.13 kg/m                 Constitutional: Pt appears in NAD non toxic afebrile               HENT: Head: NCAT.                Right Ear: External ear normal.                 Left Ear: External ear normal.                Eyes: . Pupils are equal, round, and reactive to light. Conjunctivae and EOM are normal               Nose: without d/c or deformity               Neck: Neck supple. Gross normal ROM               Cardiovascular: Normal rate and regular rhythm.                 Pulmonary/Chest: Effort normal and breath sounds without rales or wheezing.                Abd:  Soft, mild tender fullness to palpate, no guarding or  rebound, ND, + BS, no organomegaly               Neurological: Pt is alert. At baseline orientation, motor grossly intact               Skin: Skin is warm. No rashes, no other new lesions, LE edema - none               Psychiatric: Pt behavior is normal without agitation   Micro: none  Cardiac tracings I have personally interpreted today:  none  Pertinent Radiological findings (summarize): none   Lab Results  Component Value Date   WBC 10.7 (H) 08/02/2022   HGB 13.3 08/02/2022   HCT 39.3 08/02/2022   PLT 310.0 08/02/2022   GLUCOSE 114 (H) 08/02/2022   CHOL 172 04/03/2022   TRIG 181.0 (H) 04/03/2022   HDL 59.50 04/03/2022   LDLCALC 77 04/03/2022   ALT 12 08/02/2022   AST 18 08/02/2022   NA 138 08/02/2022   K 4.8 08/02/2022   CL 102 08/02/2022   CREATININE 0.73 08/02/2022   BUN 14 08/02/2022   CO2 30 08/02/2022   TSH 1.94 04/03/2022   HGBA1C 5.9 04/03/2022   MICROALBUR <0.7 03/03/2016   Assessment/Plan:  Jane Mcdonald is a 68 y.o. White or Caucasian [1] female with  has a past medical history of ANXIETY, SITUATIONAL (07/14/2010), FOOT PAIN, BILATERAL (03/30/2010), Glaucoma, Glaucoma (increased eye pressure) (02/01/2012), HYPERTENSION (07/14/2010), Palpitations, Renal artery stenosis, native, bilateral ([place]) (07/28/2010), Rosacea, and SMOKER (07/14/2010).  Abdominal pain, right lower quadrant Etiology unclear, for lab including cbc UA, also CT renal with hx of stones, that should also be adequate for appendix and ovary evaluation, to f/u GYN apr 3 as planned  Anxiety Mild stable, doubt a significant issue today, declines need for change in tx  Essential hypertension BP Readings from Last 3 Encounters:  08/02/22 128/72  04/06/22 120/62  04/04/21 136/72   Stable, pt to continue medical treatment lisinopril 20 mg qd  Followup: Return if symptoms worsen or fail to improve.  Cathlean Cower, MD 08/05/2022 6:58 AM [facility]

## 2022-08-03 ENCOUNTER — Other Ambulatory Visit: Payer: Federal, State, Local not specified - PPO

## 2022-08-03 ENCOUNTER — Other Ambulatory Visit: Payer: Self-pay | Admitting: Internal Medicine

## 2022-08-03 LAB — URINALYSIS, ROUTINE W REFLEX MICROSCOPIC
Bilirubin Urine: NEGATIVE
Ketones, ur: NEGATIVE
Nitrite: NEGATIVE
Specific Gravity, Urine: 1.02 (ref 1.000–1.030)
Total Protein, Urine: NEGATIVE
Urine Glucose: NEGATIVE
Urobilinogen, UA: 0.2 (ref 0.0–1.0)
pH: 6 (ref 5.0–8.0)

## 2022-08-03 MED ORDER — CEPHALEXIN 500 MG PO CAPS: 500.0000 mg | ORAL_CAPSULE | Freq: Three times a day (TID) | ORAL | 0 refills | Status: DC

## 2022-08-05 ENCOUNTER — Encounter: Payer: Self-pay | Admitting: Internal Medicine

## 2022-08-05 NOTE — Assessment & Plan Note (Signed)
Mild stable, doubt a significant issue today, declines need for change in tx

## 2022-08-05 NOTE — Assessment & Plan Note (Signed)
BP Readings from Last 3 Encounters:  08/02/22 128/72  04/06/22 120/62  04/04/21 136/72   Stable, pt to continue medical treatment lisinopril 20 mg qd

## 2022-08-05 NOTE — Assessment & Plan Note (Signed)
Etiology unclear, for lab including cbc UA, also CT renal with hx of stones, that should also be adequate for appendix and ovary evaluation, to f/u GYN apr 3 as planned

## 2022-08-08 DIAGNOSIS — L820 Inflamed seborrheic keratosis: Secondary | ICD-10-CM | POA: Diagnosis not present

## 2022-08-08 DIAGNOSIS — L821 Other seborrheic keratosis: Secondary | ICD-10-CM | POA: Diagnosis not present

## 2022-08-08 DIAGNOSIS — L570 Actinic keratosis: Secondary | ICD-10-CM | POA: Diagnosis not present

## 2022-08-08 DIAGNOSIS — Z85828 Personal history of other malignant neoplasm of skin: Secondary | ICD-10-CM | POA: Diagnosis not present

## 2022-08-08 DIAGNOSIS — L814 Other melanin hyperpigmentation: Secondary | ICD-10-CM | POA: Diagnosis not present

## 2022-08-17 ENCOUNTER — Encounter: Payer: Self-pay | Admitting: Obstetrics & Gynecology

## 2022-08-17 ENCOUNTER — Ambulatory Visit (INDEPENDENT_AMBULATORY_CARE_PROVIDER_SITE_OTHER): Payer: Federal, State, Local not specified - PPO | Admitting: Obstetrics & Gynecology

## 2022-08-17 VITALS — BP 123/69 | HR 74 | Resp 16 | Ht 65.0 in | Wt 144.0 lb

## 2022-08-17 DIAGNOSIS — Z1231 Encounter for screening mammogram for malignant neoplasm of breast: Secondary | ICD-10-CM

## 2022-08-17 DIAGNOSIS — Z01419 Encounter for gynecological examination (general) (routine) without abnormal findings: Secondary | ICD-10-CM | POA: Diagnosis not present

## 2022-08-17 NOTE — Progress Notes (Signed)
Last pap- 05/16/21- negative Last mammo- 12/15/21- negative

## 2022-08-17 NOTE — Progress Notes (Addendum)
WELL-WOMAN EXAMINATION Patient name: Jane Mcdonald MRN [id number]  Date of birth: 1955/08/09 Chief Complaint:   Annual Exam  History of Present Illness:   Jane Mcdonald is a 67 y.o. G0P0000 female being seen today for a routine well-woman exam.    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  Previously followed at [facility] records reviewed, follow-up Pap smears have been negative. Last Pap smear 2023  Patient's last menstrual period was 08/16/2010.  Last pap 2023.  Last mammogram: 12/2021. Last colonoscopy: 2015     08/02/2022    2:11 PM 04/06/2022    1:24 PM 04/06/2022    1:09 PM 04/04/2021    1:18 PM 04/04/2021    1:04 PM  Depression screen PHQ 2/9  Decreased Interest 0 0 0 0 0  Down, Depressed, Hopeless 0 0 0 0 0  PHQ - 2 Score 0 0 0 0 0  Altered sleeping   0    Tired, decreased energy   0    Change in appetite   0    Feeling bad or failure about yourself    0    Trouble concentrating   0    Moving slowly or fidgety/restless   0    Suicidal thoughts   0    PHQ-9 Score   0    Difficult doing work/chores   Not difficult at all        Review of Systems:   Pertinent items are noted in HPI Denies any headaches, blurred vision, fatigue, shortness of breath, chest pain, abdominal pain, bowel movements, urination, or intercourse unless otherwise stated above.  Pertinent History Reviewed:  Reviewed past medical,surgical, social and family history.  Reviewed problem list, medications and allergies. Physical Assessment:   Vitals:   08/17/22 1248  BP: 123/69  Pulse: 74  Resp: 16  Weight: 144 lb (65.3 kg)  Height: 5\' 5"  (1.651 m)  Body mass index is 23.96 kg/m.        Physical Examination:   General appearance - well appearing, and in no distress  Mental status - alert, oriented to person, place, and time  Psych:  She has a normal mood and affect  Skin - warm and dry, normal color, no suspicious lesions noted  Chest - effort normal, all lung  fields clear to auscultation bilaterally  Heart - normal rate and regular rhythm  Neck:  midline trachea, no thyromegaly or nodules  Breasts - breasts appear normal, no suspicious masses, no skin or nipple changes or  axillary nodes  Abdomen - soft, nontender, nondistended, no masses or organomegaly  Pelvic - VULVA: normal appearing vulva with no masses, tenderness or lesions  VAGINA: Narrowed introitus, normal appearing vagina with normal color and discharge, no lesions  CERVIX: normal appearing cervix without discharge or lesions, no CMT  UTERUS: uterus is felt to be normal size, shape, consistency and nontender   ADNEXA: No adnexal masses or tenderness noted.  Extremities:  No swelling or varicosities noted  Chaperone:  Mariel Aloe      Assessment & Plan:  1) Well-Woman Exam -While patient has had 3 negative Pap smears within the past 10 years due to history of dysplasia back in 2012.  Advised continued monitoring until 2032 -Mammogram ordered for August -Colonoscopy up-to-date   Orders Placed This Encounter  Procedures   MM 3D SCREENING MAMMOGRAM BILATERAL BREAST    Follow-up: Return in about 2 years (around 08/16/2024) for Annual.   Myna Hidalgo, DO Attending Obstetrician &  Gynecologist, Product/process development scientist for [employer], [place]

## 2022-12-29 ENCOUNTER — Telehealth: Payer: Self-pay | Admitting: Internal Medicine

## 2022-12-29 NOTE — Telephone Encounter (Signed)
Patient called and said she has a bee sting under her eyebrow. It has been 2 days since it happened. Patient is concerned because it is still swollen and the swelling is spreading. Patient was advised to go to urgent care or the ER due to the swelling spreading. Patient refused to go to either one stating that they would just like their PCP's opinion. Patient would like a call back at [phone].

## 2022-12-29 NOTE — Telephone Encounter (Signed)
Should be ok to use otc benadryl cream and/or cortisone for now,but if has fever, worsening redness or swelling with pain, she may need further treatment.   Most stings do improve over 3-5 days

## 2022-12-29 NOTE — Telephone Encounter (Signed)
Called and let Pt know

## 2023-01-10 ENCOUNTER — Ambulatory Visit (INDEPENDENT_AMBULATORY_CARE_PROVIDER_SITE_OTHER): Payer: Medicare Other

## 2023-01-10 DIAGNOSIS — Z1231 Encounter for screening mammogram for malignant neoplasm of breast: Secondary | ICD-10-CM | POA: Diagnosis not present

## 2023-04-02 ENCOUNTER — Other Ambulatory Visit (INDEPENDENT_AMBULATORY_CARE_PROVIDER_SITE_OTHER): Payer: Medicare Other

## 2023-04-02 DIAGNOSIS — I10 Essential (primary) hypertension: Secondary | ICD-10-CM | POA: Diagnosis not present

## 2023-04-02 DIAGNOSIS — R739 Hyperglycemia, unspecified: Secondary | ICD-10-CM | POA: Diagnosis not present

## 2023-04-02 DIAGNOSIS — E559 Vitamin D deficiency, unspecified: Secondary | ICD-10-CM

## 2023-04-02 DIAGNOSIS — E538 Deficiency of other specified B group vitamins: Secondary | ICD-10-CM | POA: Diagnosis not present

## 2023-04-02 LAB — CBC WITH DIFFERENTIAL/PLATELET
Basophils Absolute: 0 10*3/uL (ref 0.0–0.1)
Basophils Relative: 0.6 % (ref 0.0–3.0)
Eosinophils Absolute: 0.1 10*3/uL (ref 0.0–0.7)
Eosinophils Relative: 2 % (ref 0.0–5.0)
HCT: 40.4 % (ref 36.0–46.0)
Hemoglobin: 13 g/dL (ref 12.0–15.0)
Lymphocytes Relative: 24.9 % (ref 12.0–46.0)
Lymphs Abs: 1.5 10*3/uL (ref 0.7–4.0)
MCHC: 32.3 g/dL (ref 30.0–36.0)
MCV: 90.1 fL (ref 78.0–100.0)
Monocytes Absolute: 0.5 10*3/uL (ref 0.1–1.0)
Monocytes Relative: 7.8 % (ref 3.0–12.0)
Neutro Abs: 4 10*3/uL (ref 1.4–7.7)
Neutrophils Relative %: 64.7 % (ref 43.0–77.0)
Platelets: 301 10*3/uL (ref 150.0–400.0)
RBC: 4.48 Mil/uL (ref 3.87–5.11)
RDW: 13.7 % (ref 11.5–15.5)
WBC: 6.2 10*3/uL (ref 4.0–10.5)

## 2023-04-02 LAB — URINALYSIS, ROUTINE W REFLEX MICROSCOPIC
Bilirubin Urine: NEGATIVE
Ketones, ur: NEGATIVE
Leukocytes,Ua: NEGATIVE
Nitrite: NEGATIVE
Specific Gravity, Urine: 1.02 (ref 1.000–1.030)
Total Protein, Urine: NEGATIVE
Urine Glucose: NEGATIVE
Urobilinogen, UA: 0.2 (ref 0.0–1.0)
pH: 7 (ref 5.0–8.0)

## 2023-04-02 LAB — LIPID PANEL
Cholesterol: 172 mg/dL (ref 0–200)
HDL: 61.7 mg/dL (ref >39.00)
LDL Cholesterol: 83 mg/dL (ref 0–99)
NonHDL: 110.61
Total CHOL/HDL Ratio: 3
Triglycerides: 137 mg/dL (ref 0.0–149.0)
VLDL: 27.4 mg/dL (ref 0.0–40.0)

## 2023-04-02 LAB — TSH: TSH: 1.78 u[IU]/mL (ref 0.35–5.50)

## 2023-04-02 LAB — HEPATIC FUNCTION PANEL
ALT: 11 U/L (ref 0–35)
AST: 15 U/L (ref 0–37)
Albumin: 4.6 g/dL (ref 3.5–5.2)
Alkaline Phosphatase: 36 U/L — ABNORMAL LOW (ref 39–117)
Bilirubin, Direct: 0.1 mg/dL (ref 0.0–0.3)
Total Bilirubin: 0.6 mg/dL (ref 0.2–1.2)
Total Protein: 7.1 g/dL (ref 6.0–8.3)

## 2023-04-02 LAB — BASIC METABOLIC PANEL
BUN: 13 mg/dL (ref 6–23)
CO2: 25 meq/L (ref 19–32)
Calcium: 9.1 mg/dL (ref 8.4–10.5)
Chloride: 105 meq/L (ref 96–112)
Creatinine, Ser: 0.78 mg/dL (ref 0.40–1.20)
GFR: 79.8 mL/min (ref >60.00)
Glucose, Bld: 91 mg/dL (ref 70–99)
Potassium: 4.5 meq/L (ref 3.5–5.1)
Sodium: 140 meq/L (ref 135–145)

## 2023-04-02 LAB — VITAMIN D 25 HYDROXY (VIT D DEFICIENCY, FRACTURES): VITD: 49.31 ng/mL (ref 30.00–100.00)

## 2023-04-02 LAB — HEMOGLOBIN A1C: Hgb A1c MFr Bld: 5.8 % (ref 4.6–6.5)

## 2023-04-02 LAB — VITAMIN B12: Vitamin B-12: 706 pg/mL (ref 211–911)

## 2023-04-10 ENCOUNTER — Encounter: Payer: Federal, State, Local not specified - PPO | Admitting: Internal Medicine

## 2023-04-16 ENCOUNTER — Encounter: Payer: Self-pay | Admitting: Internal Medicine

## 2023-04-16 ENCOUNTER — Ambulatory Visit (INDEPENDENT_AMBULATORY_CARE_PROVIDER_SITE_OTHER): Payer: Medicare Other | Admitting: Internal Medicine

## 2023-04-16 VITALS — BP 130/78 | HR 67 | Temp 98.2°F | Ht 65.0 in | Wt 143.0 lb

## 2023-04-16 DIAGNOSIS — Z23 Encounter for immunization: Secondary | ICD-10-CM

## 2023-04-16 DIAGNOSIS — M810 Age-related osteoporosis without current pathological fracture: Secondary | ICD-10-CM

## 2023-04-16 DIAGNOSIS — Z0000 Encounter for general adult medical examination without abnormal findings: Secondary | ICD-10-CM

## 2023-04-16 DIAGNOSIS — E559 Vitamin D deficiency, unspecified: Secondary | ICD-10-CM

## 2023-04-16 DIAGNOSIS — Z0001 Encounter for general adult medical examination with abnormal findings: Secondary | ICD-10-CM | POA: Insufficient documentation

## 2023-04-16 DIAGNOSIS — E7800 Pure hypercholesterolemia, unspecified: Secondary | ICD-10-CM | POA: Diagnosis not present

## 2023-04-16 DIAGNOSIS — I10 Essential (primary) hypertension: Secondary | ICD-10-CM | POA: Diagnosis not present

## 2023-04-16 DIAGNOSIS — R739 Hyperglycemia, unspecified: Secondary | ICD-10-CM

## 2023-04-16 DIAGNOSIS — Z1211 Encounter for screening for malignant neoplasm of colon: Secondary | ICD-10-CM

## 2023-04-16 DIAGNOSIS — R931 Abnormal findings on diagnostic imaging of heart and coronary circulation: Secondary | ICD-10-CM

## 2023-04-16 DIAGNOSIS — E538 Deficiency of other specified B group vitamins: Secondary | ICD-10-CM | POA: Diagnosis not present

## 2023-04-16 MED ORDER — IBANDRONATE SODIUM 150 MG PO TABS: 150.0000 mg | ORAL_TABLET | ORAL | 3 refills | Status: DC

## 2023-04-16 MED ORDER — LISINOPRIL 20 MG PO TABS: 20.0000 mg | ORAL_TABLET | Freq: Every day | ORAL | 3 refills | Status: DC

## 2023-04-16 NOTE — Progress Notes (Unsigned)
Patient ID: Jane Mcdonald, female   DOB: 12-28-1955, 67 y.o.   MRN: [id number]         Chief Complaint:: wellness exam and osteoporosis, htn, CAD, hld       HPI:  Jane Mcdonald is a 67 y.o. female here for wellness exam; plans to see gyn for exam soon, due for colonoscopy next yr, for prevnar 20 today, o/w up to date                        Also Pt denies chest pain, increased sob or doe, wheezing, orthopnea, PND, increased LE swelling, palpitations, dizziness or syncope.   Pt denies polydipsia, polyuria, or new focal neuro s/s.    Pt denies fever, wt loss, night sweats, loss of appetite, or other constitutional symptoms  Tolerating boniva well.     Wt Readings from Last 3 Encounters:  04/16/23 143 lb (64.9 kg)  08/17/22 144 lb (65.3 kg)  08/02/22 145 lb (65.8 kg)   BP Readings from Last 3 Encounters:  04/16/23 130/78  08/17/22 123/69  08/02/22 128/72   Immunization History  Administered Date(s) Administered   Fluad Quad(high Dose 65+) 02/25/2023   Influenza Split 01/31/2011, 02/01/2012   Influenza Whole 03/16/2008, 03/19/2009, 03/30/2010   Influenza,inj,Quad PF,6+ Mos 02/06/2013, 02/12/2014, 02/18/2015, 03/13/2017, 03/03/2018, 02/27/2019   Influenza-Unspecified 03/05/2020, 03/19/2021, 03/20/2022   Janssen (J&J) SARS-COV-2 Vaccination 07/20/2019   Moderna Sars-Covid-2 Vaccination 11/15/2020   PNEUMOCOCCAL CONJUGATE-20 04/16/2023   Td 09/06/2006   Tdap 03/13/2017   Zoster Recombinant(Shingrix) 01/09/2018, 03/14/2018   Health Maintenance Due  Topic Date Due   Medicare Annual Wellness (AWV)  Never done   Cervical Cancer Screening (HPV/Pap Cotest)  10/09/2020   Colonoscopy  08/13/2023      Past Medical History:  Diagnosis Date   ANXIETY, SITUATIONAL 07/14/2010   FOOT PAIN, BILATERAL 03/30/2010   Glaucoma    sees optho/Dr. Dione Booze every 6 months   Glaucoma (increased eye pressure) 02/01/2012   HYPERTENSION 07/14/2010   Palpitations    Renal artery stenosis, native, bilateral  (HCC) 07/28/2010   Rosacea    SMOKER 07/14/2010   Past Surgical History:  Procedure Laterality Date   DILATION AND CURETTAGE OF UTERUS     s/p   OOPHORECTOMY     left only 2012, non malignant mass   TUBAL LIGATION      reports that she has never smoked. She has never used smokeless tobacco. She reports current alcohol use. She reports that she does not use drugs. family history includes Cancer in an other family member; Heart disease in her father; Hypertension in her mother; Nephrolithiasis in her father. No Known Allergies Current Outpatient Medications on File Prior to Visit  Medication Sig Dispense Refill   aspirin 81 MG EC tablet Take 1 tablet (81 mg total) by mouth daily. Swallow whole. 30 tablet 12   diclofenac sodium (VOLTAREN) 1 % GEL Apply 2 g topically 4 (four) times daily. 100 g 5   latanoprost (XALATAN) 0.005 % ophthalmic solution 1 drop. Use as directed     Multiple Vitamins-Minerals (VITAMIN D3 COMPLETE PO) Take by mouth.     triamcinolone (NASACORT) 55 MCG/ACT AERO nasal inhaler Place 2 sprays into the nose daily. 1 Inhaler 12   No current facility-administered medications on file prior to visit.        ROS:  All others reviewed and negative.  Objective        PE:  BP 130/78 (BP Location: Left  Arm, Patient Position: Sitting, Cuff Size: Normal)   Pulse 67   Temp 98.2 F (36.8 C) (Oral)   Ht 5\' 5"  (1.651 m)   Wt 143 lb (64.9 kg)   LMP 08/16/2010   SpO2 99%   BMI 23.80 kg/m                 Constitutional: Pt appears in NAD               HENT: Head: NCAT.                Right Ear: External ear normal.                 Left Ear: External ear normal.                Eyes: . Pupils are equal, round, and reactive to light. Conjunctivae and EOM are normal               Nose: without d/c or deformity               Neck: Neck supple. Gross normal ROM               Cardiovascular: Normal rate and regular rhythm.                 Pulmonary/Chest: Effort normal and breath  sounds without rales or wheezing.                Abd:  Soft, NT, ND, + BS, no organomegaly               Neurological: Pt is alert. At baseline orientation, motor grossly intact               Skin: Skin is warm. No rashes, no other new lesions, LE edema - none               Psychiatric: Pt behavior is normal without agitation   Micro: none  Cardiac tracings I have personally interpreted today:  none  Pertinent Radiological findings (summarize): none   Lab Results  Component Value Date   WBC 6.2 04/02/2023   HGB 13.0 04/02/2023   HCT 40.4 04/02/2023   PLT 301.0 04/02/2023   GLUCOSE 91 04/02/2023   CHOL 172 04/02/2023   TRIG 137.0 04/02/2023   HDL 61.70 04/02/2023   LDLCALC 83 04/02/2023   ALT 11 04/02/2023   AST 15 04/02/2023   NA 140 04/02/2023   K 4.5 04/02/2023   CL 105 04/02/2023   CREATININE 0.78 04/02/2023   BUN 13 04/02/2023   CO2 25 04/02/2023   TSH 1.78 04/02/2023   HGBA1C 5.8 04/02/2023   MICROALBUR <0.7 03/03/2016   Assessment/Plan:  Jane Mcdonald is a 67 y.o. White or Caucasian [1] female with  has a past medical history of ANXIETY, SITUATIONAL (07/14/2010), FOOT PAIN, BILATERAL (03/30/2010), Glaucoma, Glaucoma (increased eye pressure) (02/01/2012), HYPERTENSION (07/14/2010), Palpitations, Renal artery stenosis, native, bilateral (HCC) (07/28/2010), Rosacea, and SMOKER (07/14/2010).  Encounter for well adult exam with abnormal findings Age and sex appropriate education and counseling updated with regular exercise and diet Referrals for preventative services - for colonoscopy Immunizations addressed - for prevnar 20 today Smoking counseling  - none needed Evidence for depression or other mood disorder - none significant Most recent labs reviewed. I have personally reviewed and have noted: 1) the patient's medical and social history 2) The patient's current medications and supplements 3) The patient's height, weight, and BMI  have been recorded in the  chart   Essential hypertension BP Readings from Last 3 Encounters:  04/16/23 130/78  08/17/22 123/69  08/02/22 128/72   Stable, pt to continue medical treatment lisinopril 20 qd   HLD (hyperlipidemia) Lab Results  Component Value Date   LDLCALC 83 04/02/2023   Stable, pt to continue lower chol diet, goal ldl < 70, declines statin for now   Elevated coronary artery calcium score Minor, for lower chol diet, declines statin  Osteoporosis Pt to continue boniva asd,  to f/u any worsening symptoms or concerns  Followup: Return in about 1 year (around 04/15/2024).  Oliver Barre, MD 04/17/2023 8:59 PM [facility]

## 2023-04-16 NOTE — Patient Instructions (Signed)
You had the Prevnar 20 pneumonia shot today  Please continue all other medications as before, and refills have been done if requested.  Please have the pharmacy call with any other refills you may need.  Please continue your efforts at being more active, low cholesterol diet, and weight control.  You are otherwise up to date with prevention measures today.  Please keep your appointments with your specialists as you may have planned  You will be contacted regarding the referral for: colonoscopy  Please make an Appointment to return for your 1 year visit, or sooner if needed, with Lab testing by Appointment as well, to be done about 3-5 days before at the FIRST FLOOR Lab (so this is for TWO appointments - please see the scheduling desk as you leave)

## 2023-04-17 ENCOUNTER — Encounter: Payer: Self-pay | Admitting: Internal Medicine

## 2023-04-17 DIAGNOSIS — R931 Abnormal findings on diagnostic imaging of heart and coronary circulation: Secondary | ICD-10-CM | POA: Insufficient documentation

## 2023-04-17 DIAGNOSIS — E785 Hyperlipidemia, unspecified: Secondary | ICD-10-CM | POA: Insufficient documentation

## 2023-04-17 DIAGNOSIS — M810 Age-related osteoporosis without current pathological fracture: Secondary | ICD-10-CM | POA: Insufficient documentation

## 2023-04-17 NOTE — Assessment & Plan Note (Signed)
Pt to continue boniva asd,  to f/u any worsening symptoms or concerns

## 2023-04-17 NOTE — Assessment & Plan Note (Signed)
Minor, for lower chol diet, declines statin

## 2023-04-17 NOTE — Assessment & Plan Note (Signed)
BP Readings from Last 3 Encounters:  04/16/23 130/78  08/17/22 123/69  08/02/22 128/72   Stable, pt to continue medical treatment lisinopril 20 qd

## 2023-04-17 NOTE — Assessment & Plan Note (Signed)
Age and sex appropriate education and counseling updated with regular exercise and diet Referrals for preventative services - for colonoscopy Immunizations addressed - for prevnar 20 today Smoking counseling  - none needed Evidence for depression or other mood disorder - none significant Most recent labs reviewed. I have personally reviewed and have noted: 1) the patient's medical and social history 2) The patient's current medications and supplements 3) The patient's height, weight, and BMI have been recorded in the chart

## 2023-04-17 NOTE — Assessment & Plan Note (Signed)
Lab Results  Component Value Date   LDLCALC 83 04/02/2023   Stable, pt to continue lower chol diet, goal ldl < 70, declines statin for now

## 2023-07-18 ENCOUNTER — Encounter: Payer: Self-pay | Admitting: Gastroenterology

## 2023-09-04 NOTE — Progress Notes (Unsigned)
 Chief Complaint: Primary GI MD: Para Bold  HPI: Discussed the use of AI scribe software for clinical note transcription with the patient, who gave verbal consent to proceed.  History of Present Illness      PREVIOUS GI WORKUP   CT renal stone study 07/2022 for right flank pain -- Left ureteral calculus - Right ureteral calculus - Diverticulosis normal reticulitis - Contracted gallbladder, normal liver, pancreas  Past Medical History:  Diagnosis Date   ANXIETY, SITUATIONAL 07/14/2010   FOOT PAIN, BILATERAL 03/30/2010   Glaucoma    sees optho/Dr. Candi Chafe every 6 months   Glaucoma (increased eye pressure) 02/01/2012   HYPERTENSION 07/14/2010   Palpitations    Renal artery stenosis, native, bilateral (HCC) 07/28/2010   Rosacea    SMOKER 07/14/2010    Past Surgical History:  Procedure Laterality Date   DILATION AND CURETTAGE OF UTERUS     s/p   OOPHORECTOMY     left only 2012, non malignant mass   TUBAL LIGATION      Current Outpatient Medications  Medication Sig Dispense Refill   aspirin  81 MG EC tablet Take 1 tablet (81 mg total) by mouth daily. Swallow whole. 30 tablet 12   diclofenac  sodium (VOLTAREN ) 1 % GEL Apply 2 g topically 4 (four) times daily. 100 g 5   ibandronate  (BONIVA ) 150 MG tablet Take 1 tablet (150 mg total) by mouth every 30 (thirty) days. Take in the morning with a full glass of water, on an empty stomach, and do not take anything else by mouth or lie down for the next 30 min. 3 tablet 3   latanoprost (XALATAN) 0.005 % ophthalmic solution 1 drop. Use as directed     lisinopril  (ZESTRIL ) 20 MG tablet Take 1 tablet (20 mg total) by mouth daily. 90 tablet 3   Multiple Vitamins-Minerals (VITAMIN D3 COMPLETE PO) Take by mouth.     triamcinolone  (NASACORT ) 55 MCG/ACT AERO nasal inhaler Place 2 sprays into the nose daily. 1 Inhaler 12   No current facility-administered medications for this visit.    Allergies as of 09/05/2023   (No Known Allergies)     Family History  Problem Relation Age of Onset   Hypertension Mother    Heart disease Father    Nephrolithiasis Father    Cancer Other        ENT Cancer    Social History   Socioeconomic History   Marital status: Single    Spouse name: Not on file   Number of children: Not on file   Years of education: Not on file   Highest education level: Not on file  Occupational History   Not on file  Tobacco Use   Smoking status: Never   Smokeless tobacco: Never  Vaping Use   Vaping status: Never Used  Substance and Sexual Activity   Alcohol use: Yes    Comment: some   Drug use: No   Sexual activity: Yes    Birth control/protection: Surgical  Other Topics Concern   Not on file  Social History Narrative   Not on file   Social Drivers of Health   Financial Resource Strain: Unknown (06/23/2021)   Received from [facility], [facility]   Overall Financial Resource Strain (CARDIA)    Difficulty of Paying Living Expenses: Patient declined  Food Insecurity: No Food Insecurity (06/23/2021)   Received from [facility], [facility]   Hunger Vital Sign    Worried About Running Out of Food in the Last  Year: Never true    Ran Out of Food in the Last Year: Never true  Transportation Needs: No Transportation Needs (06/23/2021)   Received from [facility], [facility]   [facility] - Transportation    Lack of Transportation (Medical): No    Lack of Transportation (Non-Medical): No  Physical Activity: Insufficiently Active (06/23/2021)   Received from [facility], [facility]   Exercise Vital Sign    Days of Exercise per Week: 4 days    Minutes of Exercise per Session: 30 min  Stress: Unknown (06/23/2021)   Received from [facility], [facility] - Occupational Stress Questionnaire    Feeling of Stress : Patient declined  Social Connections: Unknown (09/12/2021)   Received from [facility], [facility]   Social Network     Social Network: Not on file  Intimate Partner Violence: Unknown (08/11/2021)   Received from [facility], [facility]   HITS    Physically Hurt: Not on file    Insult or Talk Down To: Not on file    Threaten Physical Harm: Not on file    Scream or Curse: Not on file    Review of Systems:    Constitutional: No weight loss, fever, chills, weakness or fatigue HEENT: Eyes: No change in vision               Ears, Nose, Throat:  No change in hearing or congestion Skin: No rash or itching Cardiovascular: No chest pain, chest pressure or palpitations   Respiratory: No SOB or cough Gastrointestinal: See HPI and otherwise negative Genitourinary: No dysuria or change in urinary frequency Neurological: No headache, dizziness or syncope Musculoskeletal: No new muscle or joint pain Hematologic: No bleeding or bruising Psychiatric: No history of depression or anxiety    Physical Exam:  Vital signs: LMP 08/16/2010   Constitutional: NAD, alert and cooperative Head:  Normocephalic and atraumatic. Eyes:   PEERL, EOMI. No icterus. Conjunctiva pink. Respiratory: Respirations even and unlabored. Lungs clear to auscultation bilaterally.   No wheezes, crackles, or rhonchi.  Cardiovascular:  Regular rate and rhythm. No peripheral edema, cyanosis or pallor.  Gastrointestinal:  Soft, nondistended, nontender. No rebound or guarding. Normal bowel sounds. No appreciable masses or hepatomegaly. Rectal:  Declines Msk:  Symmetrical without gross deformities. Without edema, no deformity or joint abnormality.  Neurologic:  Alert and  oriented x4;  grossly normal neurologically.  Skin:   Dry and intact without significant lesions or rashes. Psychiatric: Oriented to person, place and time. Demonstrates good judgement and reason without abnormal affect or behaviors.  Physical Exam    RELEVANT LABS AND IMAGING: CBC    Component Value Date/Time   WBC 6.2 04/02/2023 1005   RBC 4.48 04/02/2023 1005   HGB  13.0 04/02/2023 1005   HCT 40.4 04/02/2023 1005   PLT 301.0 04/02/2023 1005   MCV 90.1 04/02/2023 1005   MCH 29.6 09/14/2010 0545   MCHC 32.3 04/02/2023 1005   RDW 13.7 04/02/2023 1005   LYMPHSABS 1.5 04/02/2023 1005   MONOABS 0.5 04/02/2023 1005   EOSABS 0.1 04/02/2023 1005   BASOSABS 0.0 04/02/2023 1005    CMP     Component Value Date/Time   NA 140 04/02/2023 1005   K 4.5 04/02/2023 1005   CL 105 04/02/2023 1005   CO2 25 04/02/2023 1005   GLUCOSE 91 04/02/2023 1005   BUN 13 04/02/2023 1005   CREATININE 0.78 04/02/2023 1005   CALCIUM 9.1 04/02/2023  1005   PROT 7.1 04/02/2023 1005   ALBUMIN 4.6 04/02/2023 1005   AST 15 04/02/2023 1005   ALT 11 04/02/2023 1005   ALKPHOS 36 (L) 04/02/2023 1005   BILITOT 0.6 04/02/2023 1005   GFRNONAA >60 09/06/2010 1234   GFRAA  09/06/2010 1234    >60        The eGFR has been calculated using the MDRD equation. This calculation has not been validated in all clinical situations. eGFR's persistently <60 mL/min signify possible Chronic Kidney Disease.     Assessment/Plan:   Assessment and Plan Assessment & Plan        Gigi [facility] 09/04/2023, 9:46 AM  Cc: Roslyn Coombe, MD

## 2023-09-05 ENCOUNTER — Ambulatory Visit (INDEPENDENT_AMBULATORY_CARE_PROVIDER_SITE_OTHER): Admitting: Gastroenterology

## 2023-09-05 ENCOUNTER — Encounter: Payer: Self-pay | Admitting: Gastroenterology

## 2023-09-05 VITALS — BP 144/80 | HR 88 | Ht 64.0 in | Wt 138.4 lb

## 2023-09-05 DIAGNOSIS — Z01818 Encounter for other preprocedural examination: Secondary | ICD-10-CM

## 2023-09-05 DIAGNOSIS — Z1211 Encounter for screening for malignant neoplasm of colon: Secondary | ICD-10-CM

## 2023-09-05 MED ORDER — NA SULFATE-K SULFATE-MG SULF 17.5-3.13-1.6 GM/177ML PO SOLN: 1.0000 | Freq: Once | ORAL | 0 refills | Status: AC

## 2023-09-05 NOTE — Patient Instructions (Signed)
 You have been scheduled for a colonoscopy. Please follow written instructions given to you at your visit today.   If you use inhalers (even only as needed), please bring them with you on the day of your procedure.  DO NOT TAKE 7 DAYS PRIOR TO TEST- Trulicity (dulaglutide) Ozempic, Wegovy (semaglutide) Mounjaro (tirzepatide) Bydureon Bcise (exanatide extended release)  DO NOT TAKE 1 DAY PRIOR TO YOUR TEST Rybelsus (semaglutide) Adlyxin (lixisenatide) Victoza (liraglutide) Byetta (exanatide) ___________________________________________________________________________  Please follow up sooner if symptoms increase or worsen   Due to recent changes in healthcare laws, you may see the results of your imaging and laboratory studies on MyChart before your provider has had a chance to review them.  We understand that in some cases there may be results that are confusing or concerning to you. Not all laboratory results come back in the same time frame and the provider may be waiting for multiple results in order to interpret others.  Please give us  48 hours in order for your provider to thoroughly review all the results before contacting the office for clarification of your results.   _______________________________________________________  If your blood pressure at your visit was 140/90 or greater, please contact your primary care physician to follow up on this.  _______________________________________________________  If you are age 15 or older, your body mass index should be between 23-30. Your Body mass index is 23.75 kg/m. If this is out of the aforementioned range listed, please consider follow up with your Primary Care Provider.  If you are age 25 or younger, your body mass index should be between 19-25. Your Body mass index is 23.75 kg/m. If this is out of the aformentioned range listed, please consider follow up with your Primary Care Provider.    ________________________________________________________  The [facility] providers would like to encourage you to use MYCHART to communicate with providers for non-urgent requests or questions.  Due to long hold times on the telephone, sending your provider a message by [facility] may be a faster and more efficient way to get a response.  Please allow 48 business hours for a response.  Please remember that this is for non-urgent requests.  _______________________________________________________ Thank you for trusting me with your gastrointestinal care!   Suzanna Erp, PA

## 2023-09-27 NOTE — Progress Notes (Signed)
 Addendum: Reviewed and agree with assessment and management plan. Asha Grumbine, Carie Caddy, MD

## 2023-09-28 ENCOUNTER — Telehealth: Payer: Self-pay | Admitting: Gastroenterology

## 2023-09-28 NOTE — Telephone Encounter (Signed)
 [facility] records reviewed  Colonoscopy 08/12/2013 - Good prep -10 mm polyp in sigmoid colon.  Resected and retrieved. - Diverticulosis in sigmoid colon and descending colon - Otherwise normal examination - Biopsy showed cauterized fragments of polypoid colonic mucosa, favor hyperplastic polyp - Repeat 10 years (08/2023)

## 2023-10-03 ENCOUNTER — Encounter: Payer: Self-pay | Admitting: Internal Medicine

## 2023-10-03 ENCOUNTER — Ambulatory Visit (INDEPENDENT_AMBULATORY_CARE_PROVIDER_SITE_OTHER): Admitting: Internal Medicine

## 2023-10-03 VITALS — BP 122/74 | HR 70 | Temp 99.0°F | Ht 64.0 in | Wt 139.0 lb

## 2023-10-03 DIAGNOSIS — J309 Allergic rhinitis, unspecified: Secondary | ICD-10-CM | POA: Diagnosis not present

## 2023-10-03 DIAGNOSIS — E7800 Pure hypercholesterolemia, unspecified: Secondary | ICD-10-CM

## 2023-10-03 DIAGNOSIS — I10 Essential (primary) hypertension: Secondary | ICD-10-CM

## 2023-10-03 DIAGNOSIS — R202 Paresthesia of skin: Secondary | ICD-10-CM

## 2023-10-03 NOTE — Progress Notes (Unsigned)
 Patient ID: Jane Mcdonald, female   DOB: 1956-02-05, 68 y.o.   MRN: [id number]        Chief Complaint: follow up left facial numbness, htn, hld       HPI:  Jane Mcdonald is a 68 y.o. female here with c/o 2 mo onset left facial numbness heaviness like feelling to lowest third left facial nerve distrubition, for no apparent reason without trauma, fever, swelling, rash new oral complaints.  Fortunately no pain or facial weakness.  Did see dental, then orthodontist but neg exams except for suggestion of neuritic origin.  So here for a discussion of any other issue to consider.  Pt denies chest pain, increased sob or doe, wheezing, orthopnea, PND, increased LE swelling, palpitations, dizziness or syncope.   Pt denies polydipsia, polyuria, or new focal neuro s/s except for the above.  No hx of MS or other neuro disorder such as stroke. Does have several wks ongoing nasal allergy symptoms with clearish congestion, itch and sneezing, without fever, pain, ST, cough, swelling or wheezing.       Wt Readings from Last 3 Encounters:  10/03/23 139 lb (63 kg)  09/05/23 138 lb 6 oz (62.8 kg)  04/16/23 143 lb (64.9 kg)   BP Readings from Last 3 Encounters:  10/03/23 122/74  09/05/23 (!) 144/80  04/16/23 130/78         Past Medical History:  Diagnosis Date   ANXIETY, SITUATIONAL 07/14/2010   FOOT PAIN, BILATERAL 03/30/2010   Glaucoma    sees optho/Dr. Candi Chafe every 6 months   Glaucoma (increased eye pressure) 02/01/2012   HYPERTENSION 07/14/2010   Kidney stones    Palpitations    Renal artery stenosis, native, bilateral (HCC) 07/28/2010   Rosacea    SMOKER 07/14/2010   Past Surgical History:  Procedure Laterality Date   DILATION AND CURETTAGE OF UTERUS     s/p   OOPHORECTOMY     left only 2012, non malignant mass   TUBAL LIGATION     URETERAL STENT PLACEMENT Right     reports that she quit smoking about 25 years ago. Her smoking use included cigarettes. She has never used smokeless tobacco. She  reports current alcohol use. She reports that she does not use drugs. family history includes Arthritis in her maternal grandmother; Cancer in an other family member; Heart disease in her father; Heart failure in her mother; Hypertension in her brother and mother; Nephrolithiasis in her father; Stroke in her brother. No Known Allergies Current Outpatient Medications on File Prior to Visit  Medication Sig Dispense Refill   aspirin  81 MG EC tablet Take 1 tablet (81 mg total) by mouth daily. Swallow whole. 30 tablet 12   diclofenac  sodium (VOLTAREN ) 1 % GEL Apply 2 g topically 4 (four) times daily. (Patient taking differently: Apply 2 g topically as needed.) 100 g 5   fluorouracil (EFUDEX) 5 % cream Apply topically.     ibandronate  (BONIVA ) 150 MG tablet Take 1 tablet (150 mg total) by mouth every 30 (thirty) days. Take in the morning with a full glass of water, on an empty stomach, and do not take anything else by mouth or lie down for the next 30 min. 3 tablet 3   latanoprost (XALATAN) 0.005 % ophthalmic solution 1 drop. Use as directed     lisinopril  (ZESTRIL ) 20 MG tablet Take 1 tablet (20 mg total) by mouth daily. 90 tablet 3   moxifloxacin (VIGAMOX) 0.5 % ophthalmic solution Place 1 drop into the  right eye 4 (four) times daily.     Multiple Vitamins-Minerals (VITAMIN D3 COMPLETE PO) Take by mouth.     Na Sulfate-K Sulfate-Mg Sulfate concentrate (SUPREP) 17.5-3.13-1.6 GM/177ML SOLN SMARTSIG:1 Kit(s) By Mouth Once     triamcinolone  (NASACORT ) 55 MCG/ACT AERO nasal inhaler Place 2 sprays into the nose daily. (Patient taking differently: Place 2 sprays into the nose as needed.) 1 Inhaler 12   No current facility-administered medications on file prior to visit.        ROS:  All others reviewed and negative.  Objective        PE:  BP 122/74 (BP Location: Right Arm, Patient Position: Sitting, Cuff Size: Normal)   Pulse 70   Temp 99 F (37.2 C) (Oral)   Ht 5\' 4"  (1.626 m)   Wt 139 lb (63 kg)    LMP 08/16/2010   SpO2 98%   BMI 23.86 kg/m                 Constitutional: Pt appears in NAD               HENT: Head: NCAT.                Right Ear: External ear normal.                 Left Ear: External ear normal.                Eyes: . Pupils are equal, round, and reactive to light. Conjunctivae and EOM are normal; left face without pain, tender or swelling or rash               Nose: without d/c or deformity               Neck: Neck supple. Gross normal ROM               Cardiovascular: Normal rate and regular rhythm.                 Pulmonary/Chest: Effort normal and breath sounds without rales or wheezing.                              Neurological: Pt is alert. At baseline orientation, motor grossly intact, cn 2-1 2 intact except for decreased sens to LT to left face lowest third of 5th facial nerve distribution               Skin: Skin is warm. No rashes, no other new lesions, LE edema - none               Psychiatric: Pt behavior is normal without agitation   Micro: none  Cardiac tracings I have personally interpreted today:  none  Pertinent Radiological findings (summarize): none   Lab Results  Component Value Date   WBC 6.2 04/02/2023   HGB 13.0 04/02/2023   HCT 40.4 04/02/2023   PLT 301.0 04/02/2023   GLUCOSE 91 04/02/2023   CHOL 172 04/02/2023   TRIG 137.0 04/02/2023   HDL 61.70 04/02/2023   LDLCALC 83 04/02/2023   ALT 11 04/02/2023   AST 15 04/02/2023   NA 140 04/02/2023   K 4.5 04/02/2023   CL 105 04/02/2023   CREATININE 0.78 04/02/2023   BUN 13 04/02/2023   CO2 25 04/02/2023   TSH 1.78 04/02/2023   HGBA1C 5.8 04/02/2023   MICROALBUR <0.7 03/03/2016   Assessment/Plan:  Jane Mcdonald is a 68 y.o. White or Caucasian [1] female with  has a past medical history of ANXIETY, SITUATIONAL (07/14/2010), FOOT PAIN, BILATERAL (03/30/2010), Glaucoma, Glaucoma (increased eye pressure) (02/01/2012), HYPERTENSION (07/14/2010), Kidney stones, Palpitations, Renal  artery stenosis, native, bilateral (HCC) (07/28/2010), Rosacea, and SMOKER (07/14/2010).  Facial paresthesia Exam c/w likely localized left facial nerve lower third distribution dysfxn to sensation only; no pain or weakness; etiology unclear; as otherwise doing well pt declines MRI head or neurology for now, but would consider for any worsening.    Essential hypertension BP Readings from Last 3 Encounters:  10/03/23 122/74  09/05/23 (!) 144/80  04/16/23 130/78   Stable, pt to continue medical treatment lisinopril  20 mg qd   HLD (hyperlipidemia) Lab Results  Component Value Date   LDLCALC 83 04/02/2023   Stable, pt to continue low chol diet   Allergic rhinitis Mild to mod, for restart nasacort  asd,  to f/u any worsening symptoms or concerns  Followup: Return if symptoms worsen or fail to improve.  Rosalia Colonel, MD 10/04/2023 6:56 PM [facility]

## 2023-10-04 ENCOUNTER — Encounter: Payer: Self-pay | Admitting: Internal Medicine

## 2023-10-04 DIAGNOSIS — R202 Paresthesia of skin: Secondary | ICD-10-CM | POA: Insufficient documentation

## 2023-10-04 NOTE — Assessment & Plan Note (Signed)
 BP Readings from Last 3 Encounters:  10/03/23 122/74  09/05/23 (!) 144/80  04/16/23 130/78   Stable, pt to continue medical treatment lisinopril  20 mg qd

## 2023-10-04 NOTE — Assessment & Plan Note (Signed)
 Exam c/w likely localized left facial nerve lower third distribution dysfxn to sensation only; no pain or weakness; etiology unclear; as otherwise doing well pt declines MRI head or neurology for now, but would consider for any worsening.

## 2023-10-04 NOTE — Assessment & Plan Note (Signed)
Mild to mod, for restart nasacort asd,  to f/u any worsening symptoms or concerns

## 2023-10-04 NOTE — Assessment & Plan Note (Signed)
 Lab Results  Component Value Date   LDLCALC 83 04/02/2023   Stable, pt to continue low chol diet

## 2023-10-04 NOTE — Patient Instructions (Signed)
Please continue all other medications as before, and refills have been done if requested.  Please have the pharmacy call with any other refills you may need.  Please continue your efforts at being more active, low cholesterol diet, and weight control.  Please keep your appointments with your specialists as you may have planned     

## 2023-11-26 ENCOUNTER — Ambulatory Visit (AMBULATORY_SURGERY_CENTER): Admitting: Internal Medicine

## 2023-11-26 ENCOUNTER — Encounter: Payer: Self-pay | Admitting: Internal Medicine

## 2023-11-26 VITALS — BP 100/61 | HR 68 | Temp 97.7°F | Resp 16 | Ht 64.0 in | Wt 138.0 lb

## 2023-11-26 DIAGNOSIS — K5730 Diverticulosis of large intestine without perforation or abscess without bleeding: Secondary | ICD-10-CM

## 2023-11-26 DIAGNOSIS — D122 Benign neoplasm of ascending colon: Secondary | ICD-10-CM

## 2023-11-26 DIAGNOSIS — D120 Benign neoplasm of cecum: Secondary | ICD-10-CM | POA: Diagnosis not present

## 2023-11-26 DIAGNOSIS — K635 Polyp of colon: Secondary | ICD-10-CM | POA: Diagnosis not present

## 2023-11-26 DIAGNOSIS — Z1211 Encounter for screening for malignant neoplasm of colon: Secondary | ICD-10-CM | POA: Diagnosis not present

## 2023-11-26 MED ORDER — SODIUM CHLORIDE 0.9 % IV SOLN: 500.0000 mL | Freq: Once | INTRAVENOUS | Status: DC

## 2023-11-26 NOTE — Op Note (Signed)
 [facility] Patient Name: Jane Mcdonald Procedure Date: 11/26/2023 10:44 AM MRN: [id number] Endoscopist: Gordy CHRISTELLA Starch , MD, [id number] Age: 68 Referring MD:  Date of Birth: 04/17/1956 Gender: Female Account #: [id number] Procedure:                Colonoscopy Indications:              Screening for colorectal malignant neoplasm, Last                            colonoscopy 10 years ago Medicines:                Monitored Anesthesia Care Procedure:                Pre-Anesthesia Assessment:                           - Prior to the procedure, a History and Physical                            was performed, and patient medications and                            allergies were reviewed. The patient's tolerance of                            previous anesthesia was also reviewed. The risks                            and benefits of the procedure and the sedation                            options and risks were discussed with the patient.                            All questions were answered, and informed consent                            was obtained. Prior Anticoagulants: The patient has                            taken no anticoagulant or antiplatelet agents. ASA                            Grade Assessment: II - A patient with mild systemic                            disease. After reviewing the risks and benefits,                            the patient was deemed in satisfactory condition to                            undergo the procedure.  After obtaining informed consent, the colonoscope                            was passed under direct vision. Throughout the                            procedure, the patient's blood pressure, pulse, and                            oxygen saturations were monitored continuously. The                            Olympus Scope SN [id number] was introduced through the                            anus and advanced to the  cecum, identified by                            appendiceal orifice and ileocecal valve. The                            colonoscopy was performed without difficulty. The                            patient tolerated the procedure well. The quality                            of the bowel preparation was good. The ileocecal                            valve, appendiceal orifice, and rectum were                            photographed. Scope In: 10:52:48 AM Scope Out: 11:06:39 AM Scope Withdrawal Time: 0 hours 10 minutes 54 seconds  Total Procedure Duration: 0 hours 13 minutes 51 seconds  Findings:                 The digital rectal exam was normal.                           A 4 mm polyp was found in the cecum. The polyp was                            sessile. The polyp was removed with a cold snare.                            Resection and retrieval were complete.                           Three sessile polyps were found in the ascending                            colon. The polyps were 3 to 6 mm  in size. These                            polyps were removed with a cold snare. Resection                            and retrieval were complete.                           Scattered medium-mouthed and small-mouthed                            diverticula were found in the sigmoid colon and                            descending colon.                           The retroflexed view of the distal rectum and anal                            verge was normal and showed no anal or rectal                            abnormalities. Complications:            No immediate complications. Estimated Blood Loss:     Estimated blood loss was minimal. Impression:               - One 4 mm polyp in the cecum, removed with a cold                            snare. Resected and retrieved.                           - Three 3 to 6 mm polyps in the ascending colon,                            removed with a cold snare.  Resected and retrieved.                           - Mild diverticulosis in the sigmoid colon and in                            the descending colon.                           - The distal rectum and anal verge are normal on                            retroflexion view. Recommendation:           - Patient has a contact number available for                            emergencies. The signs and  symptoms of potential                            delayed complications were discussed with the                            patient. Return to normal activities tomorrow.                            Written discharge instructions were provided to the                            patient.                           - Resume previous diet.                           - Continue present medications.                           - Await pathology results.                           - Repeat colonoscopy is recommended. The                            colonoscopy date will be determined after pathology                            results from today's exam become available for                            review. Gordy CHRISTELLA Starch, MD 11/26/2023 11:10:01 AM This report has been signed electronically.

## 2023-11-26 NOTE — Patient Instructions (Signed)
 Educational handout provided to patient related to Polyps and Diverticulosis  Resume previous diet  Continue present medications  Awaiting pathology results  YOU HAD AN ENDOSCOPIC PROCEDURE TODAY AT THE [facility]:   Refer to the procedure report that was given to you for any specific questions about what was found during the examination.  If the procedure report does not answer your questions, please call your gastroenterologist to clarify.  If you requested that your care partner not be given the details of your procedure findings, then the procedure report has been included in a sealed envelope for you to review at your convenience later.  YOU SHOULD EXPECT: Some feelings of bloating in the abdomen. Passage of more gas than usual.  Walking can help get rid of the air that was put into your GI tract during the procedure and reduce the bloating. If you had a lower endoscopy (such as a colonoscopy or flexible sigmoidoscopy) you may notice spotting of blood in your stool or on the toilet paper. If you underwent a bowel prep for your procedure, you may not have a normal bowel movement for a few days.  Please Note:  You might notice some irritation and congestion in your nose or some drainage.  This is from the oxygen used during your procedure.  There is no need for concern and it should clear up in a day or so.  SYMPTOMS TO REPORT IMMEDIATELY:  Following lower endoscopy (colonoscopy or flexible sigmoidoscopy):  Excessive amounts of blood in the stool  Significant tenderness or worsening of abdominal pains  Swelling of the abdomen that is new, acute  Fever of 100F or higher  For urgent or emergent issues, a gastroenterologist can be reached at any hour by calling [phone]. Do not use MyChart messaging for urgent concerns.    DIET:  We do recommend a small meal at first, but then you may proceed to your regular diet.  Drink plenty of fluids but you should avoid alcoholic  beverages for 24 hours.  ACTIVITY:  You should plan to take it easy for the rest of today and you should NOT DRIVE or use heavy machinery until tomorrow (because of the sedation medicines used during the test).    FOLLOW UP: Our staff will call the number listed on your records the next business day following your procedure.  We will call around 7:15- 8:00 am to check on you and address any questions or concerns that you may have regarding the information given to you following your procedure. If we do not reach you, we will leave a message.     If any biopsies were taken you will be contacted by phone or by letter within the next 1-3 weeks.  Please call us at [phone] if you have not heard about the biopsies in 3 weeks.    SIGNATURES/CONFIDENTIALITY: You and/or your care partner have signed paperwork which will be entered into your electronic medical record.  These signatures attest to the fact that that the information above on your After Visit Summary has been reviewed and is understood.  Full responsibility of the confidentiality of this discharge information lies with you and/or your care-partner.

## 2023-11-26 NOTE — Progress Notes (Signed)
 GASTROENTEROLOGY PROCEDURE H&P NOTE   Primary Care Physician: Norleen Lynwood ORN, MD    Reason for Procedure:  Colon cancer screening  Plan:    Colonoscopy  Patient is appropriate for endoscopic procedure(s) in the ambulatory ([facility]) setting.  The nature of the procedure, as well as the risks, benefits, and alternatives were carefully and thoroughly reviewed with the patient. Ample time for discussion and questions allowed. The patient understood, was satisfied, and agreed to proceed.     HPI: Jane Mcdonald is a 68 y.o. female who presents for colonoscopy.  Medical history as below.  Tolerated the prep.  No recent chest pain or shortness of breath.  No abdominal pain today.  Past Medical History:  Diagnosis Date   ANXIETY, SITUATIONAL 07/14/2010   FOOT PAIN, BILATERAL 03/30/2010   Glaucoma    sees optho/Dr. Octavia every 6 months   Glaucoma (increased eye pressure) 02/01/2012   HYPERTENSION 07/14/2010   Kidney stones    Osteoporosis    Palpitations    Renal artery stenosis, native, bilateral (HCC) 07/28/2010   Rosacea    SMOKER 07/14/2010    Past Surgical History:  Procedure Laterality Date   COLONOSCOPY  2015   hyperplastic polyp   DILATION AND CURETTAGE OF UTERUS     s/p   OOPHORECTOMY     left only 2012, non malignant mass   TUBAL LIGATION     URETERAL STENT PLACEMENT Right     Prior to Admission medications   Medication Sig Start Date End Date Taking? Authorizing Provider  aspirin  81 MG EC tablet Take 1 tablet (81 mg total) by mouth daily. Swallow whole. 02/01/12  Yes Norleen Lynwood ORN, MD  latanoprost (XALATAN) 0.005 % ophthalmic solution 1 drop. Use as directed   Yes [provider]  lisinopril  (ZESTRIL ) 20 MG tablet Take 1 tablet (20 mg total) by mouth daily. 04/16/23  Yes Norleen Lynwood ORN, MD  Multiple Vitamins-Minerals (VITAMIN D3 COMPLETE PO) Take by mouth.   Yes [provider]  diclofenac  sodium (VOLTAREN ) 1 % GEL Apply 2 g topically 4 (four)  times daily. Patient taking differently: Apply 2 g topically as needed. 03/19/18   Norleen Lynwood ORN, MD  ibandronate  (BONIVA ) 150 MG tablet Take 1 tablet (150 mg total) by mouth every 30 (thirty) days. Take in the morning with a full glass of water, on an empty stomach, and do not take anything else by mouth or lie down for the next 30 min. 04/16/23   Norleen Lynwood ORN, MD  triamcinolone  (NASACORT ) 55 MCG/ACT AERO nasal inhaler Place 2 sprays into the nose daily. Patient taking differently: Place 2 sprays into the nose as needed. 03/19/18   Norleen Lynwood ORN, MD    Current Outpatient Medications  Medication Sig Dispense Refill   aspirin  81 MG EC tablet Take 1 tablet (81 mg total) by mouth daily. Swallow whole. 30 tablet 12   latanoprost (XALATAN) 0.005 % ophthalmic solution 1 drop. Use as directed     lisinopril  (ZESTRIL ) 20 MG tablet Take 1 tablet (20 mg total) by mouth daily. 90 tablet 3   Multiple Vitamins-Minerals (VITAMIN D3 COMPLETE PO) Take by mouth.     diclofenac  sodium (VOLTAREN ) 1 % GEL Apply 2 g topically 4 (four) times daily. (Patient taking differently: Apply 2 g topically as needed.) 100 g 5   ibandronate  (BONIVA ) 150 MG tablet Take 1 tablet (150 mg total) by mouth every 30 (thirty) days. Take in the morning with a full glass of  water, on an empty stomach, and do not take anything else by mouth or lie down for the next 30 min. 3 tablet 3   triamcinolone  (NASACORT ) 55 MCG/ACT AERO nasal inhaler Place 2 sprays into the nose daily. (Patient taking differently: Place 2 sprays into the nose as needed.) 1 Inhaler 12   Current Facility-Administered Medications  Medication Dose Route Frequency Provider Last Rate Last Admin   0.9 %  sodium chloride  infusion  500 mL Intravenous Once Cameo Schmiesing, Gordy HERO, MD        Allergies as of 11/26/2023   (No Known Allergies)    Family History  Problem Relation Age of Onset   Hypertension Mother    Heart failure Mother    Heart disease Father    Nephrolithiasis  Father    Stroke Brother    Hypertension Brother    Arthritis Maternal Grandmother    Cancer Other        ENT Cancer   Colon cancer Neg Hx    Colon polyps Neg Hx    Esophageal cancer Neg Hx    Rectal cancer Neg Hx    Stomach cancer Neg Hx     Social History   Socioeconomic History   Marital status: Single    Spouse name: Not on file   Number of children: 0   Years of education: Not on file   Highest education level: Not on file  Occupational History   Occupation: retired  Tobacco Use   Smoking status: Former    Current packs/day: 0.00    Types: Cigarettes    Quit date: 2000    Years since quitting: 25.5   Smokeless tobacco: Never  Vaping Use   Vaping status: Never Used  Substance and Sexual Activity   Alcohol use: Yes    Comment: 4 drinks a month   Drug use: Never   Sexual activity: Yes    Birth control/protection: Surgical, Post-menopausal  Other Topics Concern   Not on file  Social History Narrative   Not on file   Social Drivers of Health   Financial Resource Strain: Unknown (06/23/2021)   Received from [facility]   Overall Financial Resource Strain (CARDIA)    Difficulty of Paying Living Expenses: Patient declined  Food Insecurity: No Food Insecurity (06/23/2021)   Received from [facility]   Hunger Vital Sign    Within the past 12 months, you worried that your food would run out before you got the money to buy more.: Never true    Within the past 12 months, the food you bought just didn't last and you didn't have money to get more.: Never true  Transportation Needs: No Transportation Needs (06/23/2021)   Received from [facility] - Transportation    Lack of Transportation (Medical): No    Lack of Transportation (Non-Medical): No  Physical Activity: Insufficiently Active (06/23/2021)   Received from [facility]   Exercise Vital Sign    On average, how many days per week do you engage in moderate to strenuous exercise (like a brisk  walk)?: 4 days    On average, how many minutes do you engage in exercise at this level?: 30 min  Stress: Unknown (06/23/2021)   Received from [facility] - Occupational Stress Questionnaire    Feeling of Stress : Patient declined  Social Connections: Unknown (09/12/2021)   Received from [facility]   Social Network    Social Network: Not  on file  Intimate Partner Violence: Unknown (08/11/2021)   Received from [facility]   HITS    Physically Hurt: Not on file    Insult or Talk Down To: Not on file    Threaten Physical Harm: Not on file    Scream or Curse: Not on file    Physical Exam: Vital signs in last 24 hours: @BP  132/76   Pulse 70   Temp 97.7 F (36.5 C) (Temporal)   Ht 5' 4 (1.626 m)   Wt 138 lb (62.6 kg)   LMP 08/16/2010   SpO2 100%   BMI 23.69 kg/m  GEN: NAD EYE: Sclerae anicteric ENT: MMM CV: Non-tachycardic Pulm: CTA b/l GI: Soft, NT/ND NEURO:  Alert & Oriented x 3   Gordy Starch, MD [facility]  11/26/2023 10:46 AM

## 2023-11-26 NOTE — Progress Notes (Signed)
 Report to PACU, RN, vss, BBS= Clear.

## 2023-11-26 NOTE — Progress Notes (Signed)
Updated medical hx 

## 2023-11-27 ENCOUNTER — Telehealth: Payer: Self-pay

## 2023-11-27 NOTE — Telephone Encounter (Signed)
  Follow up Call-     11/26/2023    9:58 AM  Call back number  Post procedure Call Back phone  # [phone]  Permission to leave phone message Yes     Patient questions:  Do you have a fever, pain , or abdominal swelling? No. Pain Score  0 *  Have you tolerated food without any problems? Yes.    Have you been able to return to your normal activities? Yes.    Do you have any questions about your discharge instructions: Diet   No. Medications  No. Follow up visit  No.  Do you have questions or concerns about your Care? No.  Actions: * If pain score is 4 or above: No action needed, pain <4.

## 2023-11-28 LAB — SURGICAL PATHOLOGY

## 2023-11-29 ENCOUNTER — Ambulatory Visit: Payer: Self-pay | Admitting: Internal Medicine

## 2023-12-11 ENCOUNTER — Ambulatory Visit (INDEPENDENT_AMBULATORY_CARE_PROVIDER_SITE_OTHER)

## 2023-12-11 VITALS — BP 120/80 | HR 71 | Ht 66.0 in | Wt 140.0 lb

## 2023-12-11 DIAGNOSIS — Z0000 Encounter for general adult medical examination without abnormal findings: Secondary | ICD-10-CM

## 2023-12-11 NOTE — Patient Instructions (Addendum)
 Jane Mcdonald , Thank you for taking time out of your busy schedule to complete your Annual Wellness Visit with me. I enjoyed our conversation and look forward to speaking with you again next year. I, as well as your care team,  appreciate your ongoing commitment to your health goals. Please review the following plan we discussed and let me know if I can assist you in the future. Your Game plan/ To Do List    Referrals: If you haven't heard from the office you've been referred to, please reach out to them at the phone provided.   Follow up Visits: We will see or speak with you next year for your Next Medicare AWV with our clinical staff Have you seen your provider in the last 6 months (3 months if uncontrolled diabetes)? No  Clinician Recommendations:  Aim for 30 minutes of exercise or brisk walking, 6-8 glasses of water, and 5 servings of fruits and vegetables each day.       This is a list of the screenings recommended for you:  Health Maintenance  Topic Date Due   Pap with HPV screening  10/09/2020   COVID-19 Vaccine (3 - 2024-25 season) 01/07/2023   Flu Shot  12/07/2023   Medicare Annual Wellness Visit  12/10/2024   Mammogram  01/09/2025   DTaP/Tdap/Td vaccine (3 - Td or Tdap) 03/14/2027   Colon Cancer Screening  11/25/2028   Pneumococcal Vaccine for age over 69  Completed   DEXA scan (bone density measurement)  Completed   Hepatitis C Screening  Completed   HIV Screening  Completed   Zoster (Shingles) Vaccine  Completed   Hepatitis B Vaccine  Aged Out   HPV Vaccine  Aged Out   Meningitis B Vaccine  Aged Out    Advanced directives: (Copy Requested) Please bring a copy of your health care power of attorney and living will to the office to be added to your chart at your convenience. You can mail to [facility] [address] [place], [place] [address] or email to [email or web address] Advance Care Planning is important because it:  [x]  Makes sure you  receive the medical care that is consistent with your values, goals, and preferences  [x]  It provides guidance to your family and loved ones and reduces their decisional burden about whether or not they are making the right decisions based on your wishes.  Follow the link provided in your after visit summary or read over the paperwork we have mailed to you to help you started getting your Advance Directives in place. If you need assistance in completing these, please reach out to us  so that we can help you!

## 2023-12-11 NOTE — Progress Notes (Signed)
 Subjective:   Jane Mcdonald is a 68 y.o. who presents for a Medicare Wellness preventive visit.  As a reminder, Annual Wellness Visits don't include a physical exam, and some assessments may be limited, especially if this visit is performed virtually. We may recommend an in-person follow-up visit with your provider if needed.  Visit Complete: In person  Persons Participating in Visit: Patient.  AWV Questionnaire: No: Patient Medicare AWV questionnaire was not completed prior to this visit.  Cardiac Risk Factors include: advanced age (>73men, >83 women);dyslipidemia;hypertension     Objective:    Today's Vitals   12/11/23 1132  BP: 120/80  Pulse: 71  Weight: 140 lb (63.5 kg)  Height: 5' 6 (1.676 m)   Body mass index is 22.6 kg/m.     12/11/2023   11:31 AM 10/15/2019    1:49 PM 01/29/2017    3:13 PM 05/10/2016   11:15 AM 02/16/2014   11:21 AM 01/20/2014   11:15 AM  Advanced Directives  Does Patient Have a Medical Advance Directive? Yes No No  No  No  No   Type of Estate agent of [place];Living will       Copy of Healthcare Power of Attorney in Chart? No - copy requested       Would patient like information on creating a medical advance directive?  Yes (ED - Information included in AVS)  Yes (MAU/Ambulatory/Procedural Areas - Information given)  No - patient declined information  No - patient declined information      Data saved with a previous flowsheet row definition    Current Medications (verified) Outpatient Encounter Medications as of 12/11/2023  Medication Sig   diclofenac  sodium (VOLTAREN ) 1 % GEL Apply 2 g topically 4 (four) times daily. (Patient taking differently: Apply 2 g topically as needed (prn).)   ibandronate  (BONIVA ) 150 MG tablet Take 1 tablet (150 mg total) by mouth every 30 (thirty) days. Take in the morning with a full glass of water, on an empty stomach, and do not take anything else by mouth or lie down for the next 30 min.    latanoprost (XALATAN) 0.005 % ophthalmic solution 1 drop. Use as directed   lisinopril  (ZESTRIL ) 20 MG tablet Take 1 tablet (20 mg total) by mouth daily.   Multiple Vitamins-Minerals (VITAMIN D3 COMPLETE PO) Take by mouth.   triamcinolone  (NASACORT ) 55 MCG/ACT AERO nasal inhaler Place 2 sprays into the nose daily. (Patient taking differently: Place 2 sprays into the nose as needed (prn).)   aspirin  81 MG EC tablet Take 1 tablet (81 mg total) by mouth daily. Swallow whole.   No facility-administered encounter medications on file as of 12/11/2023.    Allergies (verified) Patient has no known allergies.   History: Past Medical History:  Diagnosis Date   ANXIETY, SITUATIONAL 07/14/2010   FOOT PAIN, BILATERAL 03/30/2010   Glaucoma    sees optho/Dr. Octavia every 6 months   Glaucoma (increased eye pressure) 02/01/2012   HYPERTENSION 07/14/2010   Kidney stones    Osteoporosis    Palpitations    Renal artery stenosis, native, bilateral (HCC) 07/28/2010   Rosacea    SMOKER 07/14/2010   Past Surgical History:  Procedure Laterality Date   COLONOSCOPY  2015   hyperplastic polyp   DILATION AND CURETTAGE OF UTERUS     s/p   OOPHORECTOMY     left only 2012, non malignant mass   TUBAL LIGATION     URETERAL STENT PLACEMENT Right  Family History  Problem Relation Age of Onset   Hypertension Mother    Heart failure Mother    Heart disease Father    Nephrolithiasis Father    Stroke Brother    Hypertension Brother    Arthritis Maternal Grandmother    Cancer Other        ENT Cancer   Colon cancer Neg Hx    Colon polyps Neg Hx    Esophageal cancer Neg Hx    Rectal cancer Neg Hx    Stomach cancer Neg Hx    Social History   Socioeconomic History   Marital status: Single    Spouse name: Not on file   Number of children: 0   Years of education: Not on file   Highest education level: Not on file  Occupational History   Occupation: retired  Tobacco Use   Smoking status: Former     Current packs/day: 0.00    Types: Cigarettes    Quit date: 2000    Years since quitting: 25.6   Smokeless tobacco: Never  Vaping Use   Vaping status: Never Used  Substance and Sexual Activity   Alcohol use: Yes    Alcohol/week: 1.0 standard drink of alcohol    Types: 1 Glasses of wine per week    Comment: 4 drinks a month   Drug use: Never   Sexual activity: Yes    Birth control/protection: Surgical, Post-menopausal  Other Topics Concern   Not on file  Social History Narrative   Not on file   Social Drivers of Health   Financial Resource Strain: Low Risk  (12/11/2023)   Overall Financial Resource Strain (CARDIA)    Difficulty of Paying Living Expenses: Not hard at all  Food Insecurity: No Food Insecurity (12/11/2023)   Hunger Vital Sign    Worried About Running Out of Food in the Last Year: Never true    Ran Out of Food in the Last Year: Never true  Transportation Needs: No Transportation Needs (12/11/2023)   PRAPARE - Administrator, Civil Service (Medical): No    Lack of Transportation (Non-Medical): No  Physical Activity: Sufficiently Active (12/11/2023)   Exercise Vital Sign    Days of Exercise per Week: 7 days    Minutes of Exercise per Session: 30 min  Stress: No Stress Concern Present (12/11/2023)   [employer] of Occupational Health - Occupational Stress Questionnaire    Feeling of Stress: Not at all  Social Connections: Moderately Isolated (12/11/2023)   Social Connection and Isolation Panel    Frequency of Communication with Friends and Family: More than three times a week    Frequency of Social Gatherings with Friends and Family: More than three times a week    Attends Religious Services: Never    Database administrator or Organizations: No    Attends Engineer, structural: Never    Marital Status: Married    Tobacco Counseling Counseling given: No    Clinical Intake:  Pre-visit preparation completed: Yes  Pain : No/denies pain      BMI - recorded: 22.6 Nutritional Status: BMI of 19-24  Normal Nutritional Risks: None Diabetes: No  Lab Results  Component Value Date   HGBA1C 5.8 04/02/2023   HGBA1C 5.9 04/03/2022   HGBA1C 5.6 03/23/2020     How often do you need to have someone help you when you read instructions, pamphlets, or other written materials from your doctor or pharmacy?: 1 - Never  Interpreter  Needed?: No  Information entered by :: Verdie Saba, CMA   Activities of Daily Living     12/11/2023   11:37 AM  In your present state of health, do you have any difficulty performing the following activities:  Hearing? 0  Vision? 0  Difficulty concentrating or making decisions? 0  Walking or climbing stairs? 0  Dressing or bathing? 0  Doing errands, shopping? 0  Preparing Food and eating ? N  Using the Toilet? N  In the past six months, have you accidently leaked urine? N  Do you have problems with loss of bowel control? N  Managing your Medications? N  Managing your Finances? N  Housekeeping or managing your Housekeeping? N    Patient Care Team: Norleen Lynwood ORN, MD as PCP - General Octavia, Charlie Hamilton, MD as Consulting Physician (Ophthalmology)  I have updated your Care Teams any recent Medical Services you may have received from other providers in the past year.     Assessment:   This is a routine wellness examination for Jane Mcdonald.  Hearing/Vision screen Hearing Screening - Comments:: Denies hearing difficulties   Vision Screening - Comments:: Wears rx glasses - up to date with routine eye exams with [facility]   Goals Addressed               This Visit's Progress     Patient Stated (pt-stated)        Patient stated she plans to monitor her weight       Depression Screen     12/11/2023   11:37 AM 10/03/2023    1:32 PM 04/16/2023    1:58 PM 08/02/2022    2:11 PM 04/06/2022    1:24 PM 04/06/2022    1:09 PM 04/04/2021    1:18 PM  PHQ 2/9 Scores  PHQ - 2 Score 0 0 0  0 0 0 0  PHQ- 9 Score 0  1   0     Fall Risk     12/11/2023   11:37 AM 10/03/2023    1:39 PM 04/16/2023    1:58 PM 08/02/2022    2:11 PM 04/06/2022    1:24 PM  Fall Risk   Falls in the past year? 0 0 0 0 0  Number falls in past yr: 0 0 0 0 0  Injury with Fall? 0 0 0 0 0  Risk for fall due to : No Fall Risks No Fall Risks No Fall Risks History of fall(s)   Follow up Falls evaluation completed;Falls prevention discussed Falls evaluation completed Falls evaluation completed Falls evaluation completed     MEDICARE RISK AT HOME:  Medicare Risk at Home Any stairs in or around the home?: Yes If so, are there any without handrails?: No Home free of loose throw rugs in walkways, pet beds, electrical cords, etc?: Yes Adequate lighting in your home to reduce risk of falls?: Yes Life alert?: No Use of a cane, walker or w/c?: No Grab bars in the bathroom?: No Shower chair or bench in shower?: No Elevated toilet seat or a handicapped toilet?: No  TIMED UP AND GO:  Was the test performed?  No  Cognitive Function: 6CIT completed        12/11/2023   11:42 AM  6CIT Screen  What Year? 0 points  What month? 0 points  What time? 0 points  Count back from 20 0 points  Months in reverse 0 points  Repeat phrase 0 points  Total Score  0 points    Immunizations Immunization History  Administered Date(s) Administered   Fluad Quad(high Dose 65+) 02/25/2023   Influenza Split 01/31/2011, 02/01/2012   Influenza Whole 03/16/2008, 03/19/2009, 03/30/2010   Influenza,inj,Quad PF,6+ Mos 02/06/2013, 02/12/2014, 02/18/2015, 03/13/2017, 03/03/2018, 02/27/2019   Influenza-Unspecified 03/05/2020, 03/19/2021, 03/20/2022   Janssen (J&J) SARS-COV-2 Vaccination 07/20/2019   Moderna Sars-Covid-2 Vaccination 11/15/2020   PNEUMOCOCCAL CONJUGATE-20 04/16/2023   Td 09/06/2006   Tdap 03/13/2017   Zoster Recombinant(Shingrix) 01/09/2018, 03/14/2018    Screening Tests Health Maintenance  Topic Date Due    Cervical Cancer Screening (HPV/Pap Cotest)  10/09/2020   COVID-19 Vaccine (3 - 2024-25 season) 01/07/2023   INFLUENZA VACCINE  12/07/2023   Medicare Annual Wellness (AWV)  12/10/2024   MAMMOGRAM  01/09/2025   DTaP/Tdap/Td (3 - Td or Tdap) 03/14/2027   Colonoscopy  11/25/2028   Pneumococcal Vaccine: 50+ Years  Completed   DEXA SCAN  Completed   Hepatitis C Screening  Completed   HIV Screening  Completed   Zoster Vaccines- Shingrix  Completed   Hepatitis B Vaccines  Aged Out   HPV VACCINES  Aged Out   Meningococcal B Vaccine  Aged Out    Health Maintenance  Health Maintenance Due  Topic Date Due   Cervical Cancer Screening (HPV/Pap Cotest)  10/09/2020   COVID-19 Vaccine (3 - 2024-25 season) 01/07/2023   INFLUENZA VACCINE  12/07/2023   Health Maintenance Items Addressed:12/11/2023  Pap smear status: Patient plans to schedule an appt w/Gynecologist.   Additional Screening:  Vision Screening: Recommended annual ophthalmology exams for early detection of glaucoma and other disorders of the eye. Would you like a referral to an eye doctor? No  Patient has had an eye exam in early 2025 w/[facility].  Dental Screening: Recommended annual dental exams for proper oral hygiene  Community Resource Referral / Chronic Care Management: CRR required this visit?  No   CCM required this visit?  No   Plan:    I have personally reviewed and noted the following in the patient's chart:   Medical and social history Use of alcohol, tobacco or illicit drugs  Current medications and supplements including opioid prescriptions. Patient is not currently taking opioid prescriptions. Functional ability and status Nutritional status Physical activity Advanced directives List of other physicians Hospitalizations, surgeries, and ER visits in previous 12 months Vitals Screenings to include cognitive, depression, and falls Referrals and appointments  In addition, I have reviewed and  discussed with patient certain preventive protocols, quality metrics, and best practice recommendations. A written personalized care plan for preventive services as well as general preventive health recommendations were provided to patient.   Verdie CHRISTELLA Saba, CMA   12/11/2023   After Visit Summary: (In Person-Declined) Patient declined AVS at this time.  Notes: Nothing significant to report at this time.

## 2024-02-20 ENCOUNTER — Other Ambulatory Visit: Payer: Self-pay | Admitting: Internal Medicine

## 2024-02-20 DIAGNOSIS — Z1231 Encounter for screening mammogram for malignant neoplasm of breast: Secondary | ICD-10-CM

## 2024-03-15 ENCOUNTER — Ambulatory Visit (INDEPENDENT_AMBULATORY_CARE_PROVIDER_SITE_OTHER)

## 2024-03-15 DIAGNOSIS — Z1231 Encounter for screening mammogram for malignant neoplasm of breast: Secondary | ICD-10-CM

## 2024-04-07 ENCOUNTER — Other Ambulatory Visit (INDEPENDENT_AMBULATORY_CARE_PROVIDER_SITE_OTHER)

## 2024-04-07 DIAGNOSIS — E7800 Pure hypercholesterolemia, unspecified: Secondary | ICD-10-CM | POA: Diagnosis not present

## 2024-04-07 DIAGNOSIS — E559 Vitamin D deficiency, unspecified: Secondary | ICD-10-CM

## 2024-04-07 DIAGNOSIS — E538 Deficiency of other specified B group vitamins: Secondary | ICD-10-CM

## 2024-04-07 DIAGNOSIS — R739 Hyperglycemia, unspecified: Secondary | ICD-10-CM

## 2024-04-07 LAB — CBC WITH DIFFERENTIAL/PLATELET
Basophils Absolute: 0 K/uL (ref 0.0–0.1)
Basophils Relative: 0.6 % (ref 0.0–3.0)
Eosinophils Absolute: 0.1 K/uL (ref 0.0–0.7)
Eosinophils Relative: 1.2 % (ref 0.0–5.0)
HCT: 38.6 % (ref 36.0–46.0)
Hemoglobin: 13 g/dL (ref 12.0–15.0)
Lymphocytes Relative: 21 % (ref 12.0–46.0)
Lymphs Abs: 1.6 K/uL (ref 0.7–4.0)
MCHC: 33.6 g/dL (ref 30.0–36.0)
MCV: 88.3 fl (ref 78.0–100.0)
Monocytes Absolute: 0.5 K/uL (ref 0.1–1.0)
Monocytes Relative: 6.4 % (ref 3.0–12.0)
Neutro Abs: 5.5 K/uL (ref 1.4–7.7)
Neutrophils Relative %: 70.8 % (ref 43.0–77.0)
Platelets: 286 K/uL (ref 150.0–400.0)
RBC: 4.37 Mil/uL (ref 3.87–5.11)
RDW: 13.7 % (ref 11.5–15.5)
WBC: 7.7 K/uL (ref 4.0–10.5)

## 2024-04-07 LAB — HEPATIC FUNCTION PANEL
ALT: 12 U/L (ref 0–35)
AST: 14 U/L (ref 0–37)
Albumin: 4.7 g/dL (ref 3.5–5.2)
Alkaline Phosphatase: 32 U/L — ABNORMAL LOW (ref 39–117)
Bilirubin, Direct: 0.1 mg/dL (ref 0.0–0.3)
Total Bilirubin: 0.5 mg/dL (ref 0.2–1.2)
Total Protein: 7 g/dL (ref 6.0–8.3)

## 2024-04-07 LAB — LIPID PANEL
Cholesterol: 175 mg/dL (ref 0–200)
HDL: 60 mg/dL (ref >39.00)
LDL Cholesterol: 87 mg/dL (ref 0–99)
NonHDL: 115.05
Total CHOL/HDL Ratio: 3
Triglycerides: 140 mg/dL (ref 0.0–149.0)
VLDL: 28 mg/dL (ref 0.0–40.0)

## 2024-04-07 LAB — VITAMIN D 25 HYDROXY (VIT D DEFICIENCY, FRACTURES): VITD: 48.04 ng/mL (ref 30.00–100.00)

## 2024-04-07 LAB — BASIC METABOLIC PANEL WITH GFR
BUN: 13 mg/dL (ref 6–23)
CO2: 30 meq/L (ref 19–32)
Calcium: 9.4 mg/dL (ref 8.4–10.5)
Chloride: 101 meq/L (ref 96–112)
Creatinine, Ser: 0.78 mg/dL (ref 0.40–1.20)
GFR: 79.23 mL/min (ref >60.00)
Glucose, Bld: 105 mg/dL — ABNORMAL HIGH (ref 70–99)
Potassium: 4.4 meq/L (ref 3.5–5.1)
Sodium: 140 meq/L (ref 135–145)

## 2024-04-07 LAB — URINALYSIS, ROUTINE W REFLEX MICROSCOPIC
Bilirubin Urine: NEGATIVE
Ketones, ur: NEGATIVE
Leukocytes,Ua: NEGATIVE
Nitrite: NEGATIVE
Specific Gravity, Urine: 1.015 (ref 1.000–1.030)
Total Protein, Urine: NEGATIVE
Urine Glucose: NEGATIVE
Urobilinogen, UA: 0.2 (ref 0.0–1.0)
pH: 7 (ref 5.0–8.0)

## 2024-04-07 LAB — TSH: TSH: 1.47 u[IU]/mL (ref 0.35–5.50)

## 2024-04-07 LAB — HEMOGLOBIN A1C: Hgb A1c MFr Bld: 5.5 % (ref 4.6–6.5)

## 2024-04-07 LAB — VITAMIN B12: Vitamin B-12: 583 pg/mL (ref 211–911)

## 2024-04-15 ENCOUNTER — Ambulatory Visit (INDEPENDENT_AMBULATORY_CARE_PROVIDER_SITE_OTHER): Payer: Medicare Other | Admitting: Internal Medicine

## 2024-04-15 ENCOUNTER — Encounter: Payer: Self-pay | Admitting: Internal Medicine

## 2024-04-15 VITALS — BP 110/68 | HR 66 | Temp 98.3°F | Ht 66.0 in | Wt 140.0 lb

## 2024-04-15 DIAGNOSIS — Z0000 Encounter for general adult medical examination without abnormal findings: Secondary | ICD-10-CM

## 2024-04-15 DIAGNOSIS — E7800 Pure hypercholesterolemia, unspecified: Secondary | ICD-10-CM

## 2024-04-15 DIAGNOSIS — R739 Hyperglycemia, unspecified: Secondary | ICD-10-CM

## 2024-04-15 DIAGNOSIS — I10 Essential (primary) hypertension: Secondary | ICD-10-CM

## 2024-04-15 DIAGNOSIS — H409 Unspecified glaucoma: Secondary | ICD-10-CM | POA: Diagnosis not present

## 2024-04-15 MED ORDER — LISINOPRIL 20 MG PO TABS: 20.0000 mg | ORAL_TABLET | Freq: Every day | ORAL | 3 refills | Status: AC

## 2024-04-15 NOTE — Patient Instructions (Addendum)
 We will have the Pneumovax shot if in stock today (otherwise we can hold, as the pharmacies generally dont carry this)  Please continue all other medications as before, and refills have been done if requested.  Please have the pharmacy call with any other refills you may need.  Please continue your efforts at being more active, low cholesterol diet, and weight control.  You are otherwise up to date with prevention measures today.  Please keep your appointments with your specialists as you may have planned  Please make an Appointment to return for your 1 year visit, or sooner if needed, with Lab testing by Appointment as well, to be done about 3-5 days before at the FIRST FLOOR Lab (so this is for TWO appointments - please see the scheduling desk as you leave)

## 2024-04-15 NOTE — Assessment & Plan Note (Addendum)
 Mild, asympt, pt advised for increased activity, wt control

## 2024-04-15 NOTE — Assessment & Plan Note (Signed)
 Pt follows regularly with optho

## 2024-04-15 NOTE — Assessment & Plan Note (Signed)

## 2024-04-15 NOTE — Assessment & Plan Note (Signed)
 BP Readings from Last 3 Encounters:  04/15/24 110/68  12/11/23 120/80  11/26/23 100/61   Stable, pt to continue medical treatment lisinopril  20 mg qd

## 2024-04-15 NOTE — Assessment & Plan Note (Signed)
 Lab Results  Component Value Date   LDLCALC 87 04/07/2024   Stable, pt to continue lower chol diet

## 2024-04-15 NOTE — Progress Notes (Signed)
 Patient ID: Jane Mcdonald, female   DOB: 1955-12-27, 68 y.o.   MRN: [id number]         Chief Complaint:: wellness exam and htn,, hld,        HPI:  Jane Mcdonald is a 68 y.o. female here for wellness exam; up to date                        Also Pt denies chest pain, increased sob or doe, wheezing, orthopnea, PND, increased LE swelling, palpitations, dizziness or syncope.   Pt denies polydipsia, polyuria, or new focal neuro s/s.   Pt denies polydipsia, polyuria, or new focal neuro s/s.    Pt denies fever, wt loss, night sweats, loss of appetite, or other constitutional symptoms     Wt Readings from Last 3 Encounters:  04/15/24 140 lb (63.5 kg)  12/11/23 140 lb (63.5 kg)  11/26/23 138 lb (62.6 kg)   BP Readings from Last 3 Encounters:  04/15/24 110/68  12/11/23 120/80  11/26/23 100/61   Immunization History  Administered Date(s) Administered   Fluad Quad(high Dose 65+) 02/25/2023   Influenza Split 01/31/2011, 02/01/2012   Influenza Whole 03/16/2008, 03/19/2009, 03/30/2010   Influenza,inj,Quad PF,6+ Mos 02/06/2013, 02/12/2014, 02/18/2015, 03/13/2017, 03/03/2018, 02/27/2019   Influenza-Unspecified 03/05/2020, 03/19/2021, 03/20/2022, 02/25/2024   Janssen (J&J) SARS-COV-2 Vaccination 07/20/2019   Moderna Sars-Covid-2 Vaccination 11/15/2020   PNEUMOCOCCAL CONJUGATE-20 04/16/2023   Td 09/06/2006   Tdap 03/13/2017   Zoster Recombinant(Shingrix) 01/09/2018, 03/14/2018   Health Maintenance Due  Topic Date Due   COVID-19 Vaccine (3 - 2025-26 season) 01/07/2024      Past Medical History:  Diagnosis Date   ANXIETY, SITUATIONAL 07/14/2010   FOOT PAIN, BILATERAL 03/30/2010   Glaucoma    sees optho/Dr. Octavia every 6 months   Glaucoma (increased eye pressure) 02/01/2012   HYPERTENSION 07/14/2010   Kidney stones    Osteoporosis    Palpitations    Renal artery stenosis, native, bilateral 07/28/2010   Rosacea    SMOKER 07/14/2010   Past Surgical History:  Procedure Laterality Date    COLONOSCOPY  2015   hyperplastic polyp   DILATION AND CURETTAGE OF UTERUS     s/p   OOPHORECTOMY     left only 2012, non malignant mass   TUBAL LIGATION     URETERAL STENT PLACEMENT Right     reports that she quit smoking about 25 years ago. Her smoking use included cigarettes. She has never used smokeless tobacco. She reports current alcohol use of about 1.0 standard drink of alcohol per week. She reports that she does not use drugs. family history includes Arthritis in her maternal grandmother; Cancer in an other family member; Heart disease in her father; Heart failure in her mother; Hypertension in her brother and mother; Nephrolithiasis in her father; Stroke in her brother. No Known Allergies Current Outpatient Medications on File Prior to Visit  Medication Sig Dispense Refill   aspirin  81 MG EC tablet Take 1 tablet (81 mg total) by mouth daily. Swallow whole. 30 tablet 12   brimonidine (ALPHAGAN P) 0.1 % SOLN Apply 1 drop to eye 2 (two) times daily.     diclofenac  sodium (VOLTAREN ) 1 % GEL Apply 2 g topically 4 (four) times daily. (Patient taking differently: Apply 2 g topically as needed (prn).) 100 g 5   ibandronate  (BONIVA ) 150 MG tablet Take 1 tablet (150 mg total) by mouth every 30 (thirty) days. Take in the morning with a full  glass of water, on an empty stomach, and do not take anything else by mouth or lie down for the next 30 min. 3 tablet 3   latanoprost (XALATAN) 0.005 % ophthalmic solution 1 drop. Use as directed     Multiple Vitamins-Minerals (VITAMIN D3 COMPLETE PO) Take by mouth.     triamcinolone  (NASACORT ) 55 MCG/ACT AERO nasal inhaler Place 2 sprays into the nose daily. (Patient taking differently: Place 2 sprays into the nose as needed (prn).) 1 Inhaler 12   VYZULTA 0.024 % SOLN Apply 1 drop to eye at bedtime.     No current facility-administered medications on file prior to visit.        ROS:  All others reviewed and negative.  Objective        PE:  BP 110/68  (BP Location: Right Arm, Patient Position: Sitting, Cuff Size: Normal)   Pulse 66   Temp 98.3 F (36.8 C) (Oral)   Ht 5' 6 (1.676 m)   Wt 140 lb (63.5 kg)   LMP 08/16/2010   SpO2 99%   BMI 22.60 kg/m                 Constitutional: Pt appears in NAD               HENT: Head: NCAT.                Right Ear: External ear normal.                 Left Ear: External ear normal.                Eyes: . Pupils are equal, round, and reactive to light. Conjunctivae and EOM are normal               Nose: without d/c or deformity               Neck: Neck supple. Gross normal ROM               Cardiovascular: Normal rate and regular rhythm.                 Pulmonary/Chest: Effort normal and breath sounds without rales or wheezing.                Abd:  Soft, NT, ND, + BS, no organomegaly               Neurological: Pt is alert. At baseline orientation, motor grossly intact               Skin: Skin is warm. No rashes, no other new lesions, LE edema - none               Psychiatric: Pt behavior is normal without agitation   Micro: none  Cardiac tracings I have personally interpreted today:  none  Pertinent Radiological findings (summarize): none   Lab Results  Component Value Date   WBC 7.7 04/07/2024   HGB 13.0 04/07/2024   HCT 38.6 04/07/2024   PLT 286.0 04/07/2024   GLUCOSE 105 (H) 04/07/2024   CHOL 175 04/07/2024   TRIG 140.0 04/07/2024   HDL 60.00 04/07/2024   LDLCALC 87 04/07/2024   ALT 12 04/07/2024   AST 14 04/07/2024   NA 140 04/07/2024   K 4.4 04/07/2024   CL 101 04/07/2024   CREATININE 0.78 04/07/2024   BUN 13 04/07/2024   CO2 30 04/07/2024   TSH 1.47 04/07/2024  HGBA1C 5.5 04/07/2024   Assessment/Plan:  Jane Mcdonald is a 68 y.o. White or Caucasian [1] female with  has a past medical history of ANXIETY, SITUATIONAL (07/14/2010), FOOT PAIN, BILATERAL (03/30/2010), Glaucoma, Glaucoma (increased eye pressure) (02/01/2012), HYPERTENSION (07/14/2010), Kidney stones,  Osteoporosis, Palpitations, Renal artery stenosis, native, bilateral (07/28/2010), Rosacea, and SMOKER (07/14/2010).  Preventative health care Age and sex appropriate education and counseling updated with regular exercise and diet Referrals for preventative services - none needed Immunizations addressed - none needed Smoking counseling  - none needed Evidence for depression or other mood disorder - none significant Most recent labs reviewed. I have personally reviewed and have noted: 1) the patient's medical and social history 2) The patient's current medications and supplements 3) The patient's height, weight, and BMI have been recorded in the chart   HLD (hyperlipidemia) Lab Results  Component Value Date   LDLCALC 87 04/07/2024   Stable, pt to continue lower chol diet   Essential hypertension BP Readings from Last 3 Encounters:  04/15/24 110/68  12/11/23 120/80  11/26/23 100/61   Stable, pt to continue medical treatment lisinopril  20 mg qd   Glaucoma (increased eye pressure) Pt follows regularly with optho  Hyperglycemia Mild, asympt, pt advised for increased activity, wt control Followup: Return in about 1 year (around 04/15/2025).  Lynwood Rush, MD 04/15/2024 1:37 PM [facility]

## 2024-04-22 ENCOUNTER — Other Ambulatory Visit: Payer: Self-pay | Admitting: Internal Medicine

## 2024-06-11 ENCOUNTER — Telehealth: Payer: Self-pay | Admitting: *Deleted

## 2024-06-11 NOTE — Telephone Encounter (Signed)
 Returned call from 9:58 AM. Left patient a message to call and schedule annual with Dr. Cris.

## 2024-08-18 ENCOUNTER — Ambulatory Visit: Admitting: Obstetrics & Gynecology

## 2024-12-19 ENCOUNTER — Ambulatory Visit
